# Patient Record
Sex: Male | Born: 1975 | Race: White | Hispanic: No | Marital: Married | Smoking: Never smoker
Health system: Southern US, Community
[De-identification: ages and names within clinical notes are randomized; demographics above are authoritative.]

## PROBLEM LIST (undated history)

## (undated) DIAGNOSIS — E079 Disorder of thyroid, unspecified: Secondary | ICD-10-CM

## (undated) DIAGNOSIS — Z9289 Personal history of other medical treatment: Secondary | ICD-10-CM

## (undated) DIAGNOSIS — G4700 Insomnia, unspecified: Secondary | ICD-10-CM

## (undated) DIAGNOSIS — E790 Hyperuricemia without signs of inflammatory arthritis and tophaceous disease: Secondary | ICD-10-CM

## (undated) DIAGNOSIS — I10 Essential (primary) hypertension: Secondary | ICD-10-CM

## (undated) DIAGNOSIS — K5900 Constipation, unspecified: Secondary | ICD-10-CM

## (undated) DIAGNOSIS — N19 Unspecified kidney failure: Secondary | ICD-10-CM

## (undated) DIAGNOSIS — E213 Hyperparathyroidism, unspecified: Secondary | ICD-10-CM

## (undated) DIAGNOSIS — K219 Gastro-esophageal reflux disease without esophagitis: Secondary | ICD-10-CM

## (undated) DIAGNOSIS — K259 Gastric ulcer, unspecified as acute or chronic, without hemorrhage or perforation: Secondary | ICD-10-CM

## (undated) HISTORY — PX: KIDNEY SURGERY: SHX687

## (undated) HISTORY — PX: APPENDECTOMY: SHX54

## (undated) HISTORY — DX: Essential (primary) hypertension: I10

## (undated) HISTORY — PX: COLONOSCOPY: SHX174

---

## 2002-03-08 DIAGNOSIS — K259 Gastric ulcer, unspecified as acute or chronic, without hemorrhage or perforation: Secondary | ICD-10-CM

## 2002-03-08 HISTORY — DX: Gastric ulcer, unspecified as acute or chronic, without hemorrhage or perforation: K25.9

## 2007-08-19 ENCOUNTER — Emergency Department (HOSPITAL_BASED_OUTPATIENT_CLINIC_OR_DEPARTMENT_OTHER): Admission: EM | Admit: 2007-08-19 | Discharge: 2007-08-19 | Payer: Self-pay | Admitting: Emergency Medicine

## 2010-12-03 LAB — DIFFERENTIAL
Basophils Absolute: 0.1
Basophils Relative: 1
Eosinophils Absolute: 0
Eosinophils Relative: 0
Lymphocytes Relative: 9 — ABNORMAL LOW
Lymphs Abs: 1.5
Monocytes Absolute: 0.9
Monocytes Relative: 6
Neutro Abs: 13.5 — ABNORMAL HIGH
Neutrophils Relative %: 84 — ABNORMAL HIGH

## 2010-12-03 LAB — CBC
HCT: 40.4
Hemoglobin: 13.9
MCHC: 34.4
MCV: 89.3
Platelets: 236
RBC: 4.53
RDW: 12.8
WBC: 16 — ABNORMAL HIGH

## 2010-12-03 LAB — COMPREHENSIVE METABOLIC PANEL WITH GFR
ALT: 8
AST: 18
Albumin: 4.5
Alkaline Phosphatase: 98
Calcium: 10.1
GFR calc Af Amer: 18 — ABNORMAL LOW
Glucose, Bld: 115 — ABNORMAL HIGH
Potassium: 5.3 — ABNORMAL HIGH
Sodium: 143
Total Protein: 8.1

## 2010-12-03 LAB — COMPREHENSIVE METABOLIC PANEL
BUN: 66 — ABNORMAL HIGH
CO2: 18 — ABNORMAL LOW
Chloride: 113 — ABNORMAL HIGH
Creatinine, Ser: 4.6 — ABNORMAL HIGH
GFR calc non Af Amer: 15 — ABNORMAL LOW
Total Bilirubin: 1

## 2011-04-24 ENCOUNTER — Emergency Department (HOSPITAL_BASED_OUTPATIENT_CLINIC_OR_DEPARTMENT_OTHER)
Admission: EM | Admit: 2011-04-24 | Discharge: 2011-04-24 | Disposition: A | Discharge status: Home / Self Care | Payer: 59 | Attending: Emergency Medicine | Admitting: Emergency Medicine

## 2011-04-24 ENCOUNTER — Encounter (HOSPITAL_BASED_OUTPATIENT_CLINIC_OR_DEPARTMENT_OTHER): Payer: Self-pay | Admitting: *Deleted

## 2011-04-24 DIAGNOSIS — A088 Other specified intestinal infections: Secondary | ICD-10-CM | POA: Insufficient documentation

## 2011-04-24 DIAGNOSIS — N179 Acute kidney failure, unspecified: Secondary | ICD-10-CM | POA: Insufficient documentation

## 2011-04-24 DIAGNOSIS — A084 Viral intestinal infection, unspecified: Secondary | ICD-10-CM

## 2011-04-24 DIAGNOSIS — N189 Chronic kidney disease, unspecified: Secondary | ICD-10-CM

## 2011-04-24 DIAGNOSIS — E860 Dehydration: Secondary | ICD-10-CM | POA: Insufficient documentation

## 2011-04-24 DIAGNOSIS — R112 Nausea with vomiting, unspecified: Secondary | ICD-10-CM | POA: Insufficient documentation

## 2011-04-24 HISTORY — DX: Unspecified kidney failure: N19

## 2011-04-24 LAB — BASIC METABOLIC PANEL
CO2: 17 mEq/L — ABNORMAL LOW (ref 19–32)
Chloride: 113 mEq/L — ABNORMAL HIGH (ref 96–112)
Glucose, Bld: 125 mg/dL — ABNORMAL HIGH (ref 70–99)
Potassium: 5.1 mEq/L (ref 3.5–5.1)
Sodium: 141 mEq/L (ref 135–145)

## 2011-04-24 LAB — DIFFERENTIAL
Basophils Absolute: 0 10*3/uL (ref 0.0–0.1)
Lymphocytes Relative: 5 % — ABNORMAL LOW (ref 12–46)
Monocytes Absolute: 1 10*3/uL (ref 0.1–1.0)
Neutro Abs: 21 10*3/uL — ABNORMAL HIGH (ref 1.7–7.7)
Neutrophils Relative %: 91 % — ABNORMAL HIGH (ref 43–77)

## 2011-04-24 LAB — COMPREHENSIVE METABOLIC PANEL
ALT: 15 U/L (ref 0–53)
AST: 11 U/L (ref 0–37)
Albumin: 4 g/dL (ref 3.5–5.2)
Alkaline Phosphatase: 91 U/L (ref 39–117)
BUN: 77 mg/dL — ABNORMAL HIGH (ref 6–23)
Chloride: 110 mEq/L (ref 96–112)
Potassium: 4.5 mEq/L (ref 3.5–5.1)
Sodium: 141 mEq/L (ref 135–145)
Total Bilirubin: 0.7 mg/dL (ref 0.3–1.2)

## 2011-04-24 LAB — CBC
HCT: 38.4 % — ABNORMAL LOW (ref 39.0–52.0)
Hemoglobin: 13.3 g/dL (ref 13.0–17.0)
RDW: 13.4 % (ref 11.5–15.5)
WBC: 23.2 10*3/uL — ABNORMAL HIGH (ref 4.0–10.5)

## 2011-04-24 LAB — URINALYSIS, ROUTINE W REFLEX MICROSCOPIC
Bilirubin Urine: NEGATIVE
Glucose, UA: 100 mg/dL — AB
Ketones, ur: NEGATIVE mg/dL
Protein, ur: >300 mg/dL — AB

## 2011-04-24 LAB — URINE MICROSCOPIC-ADD ON

## 2011-04-24 MED ORDER — HYDROCODONE-ACETAMINOPHEN 5-325 MG PO TABS: 2.0000 | ORAL_TABLET | ORAL | Status: AC | PRN

## 2011-04-24 MED ORDER — HYDROMORPHONE HCL PF 1 MG/ML IJ SOLN
1.0000 mg | Freq: Once | INTRAMUSCULAR | Status: AC
  Administered 2011-04-24: 1 mg via INTRAVENOUS
  Filled 2011-04-24: qty 1

## 2011-04-24 MED ORDER — ONDANSETRON HCL 8 MG PO TABS: 8.0000 mg | ORAL_TABLET | Freq: Three times a day (TID) | ORAL | Status: AC | PRN

## 2011-04-24 MED ORDER — SODIUM CHLORIDE 0.9 % IV BOLUS (SEPSIS)
1000.0000 mL | Freq: Once | INTRAVENOUS | Status: AC
  Administered 2011-04-24: 1000 mL via INTRAVENOUS

## 2011-04-24 MED ORDER — SODIUM CHLORIDE 0.9 % IV SOLN
INTRAVENOUS | Status: DC
  Administered 2011-04-24: 13:00:00 via INTRAVENOUS

## 2011-04-24 MED ORDER — FAMOTIDINE 20 MG PO TABS: 20.0000 mg | ORAL_TABLET | Freq: Two times a day (BID) | ORAL | Status: DC | PRN

## 2011-04-24 MED ORDER — ONDANSETRON HCL 4 MG/2ML IJ SOLN
4.0000 mg | Freq: Once | INTRAMUSCULAR | Status: AC
  Administered 2011-04-24: 4 mg via INTRAVENOUS
  Filled 2011-04-24: qty 2

## 2011-04-24 MED ORDER — FAMOTIDINE IN NACL 20-0.9 MG/50ML-% IV SOLN
20.0000 mg | Freq: Once | INTRAVENOUS | Status: AC
  Administered 2011-04-24: 20 mg via INTRAVENOUS
  Filled 2011-04-24: qty 50

## 2011-04-24 NOTE — Discharge Instructions (Signed)
Dehydration Dehydration is the reduction of water and fluid from the body to a level below that required for proper functioning. CAUSES  Dehydration occurs when there is excessive fluid loss from the body or when loss of normal fluids is not adequately replaced.  Loss of fluids occurs in vomiting, diarrhea, excessive sweating, excessive urine output, or excessive loss of fluid from the lungs (as occurs in fever or in patients on a ventilator).   Inadequate fluid replacement occurs with nausea or decreased appetite due to illness, sore throat, or mouth pain.  SYMPTOMS  Mild dehydration  Thirst (infants and young children may not be able to tell you they are thirsty).   Dry lips.   Slightly dry mouth membranes.  Moderate dehydration  Very dry mouth membranes.   Sunken eyes.   Sunken soft spot (fontanelle) on infant's head.   Skin does not bounce back quickly when lightly pinched and released.   Decreased urine production.   Decreased tear production.  Severe dehydration  Rapid, weak pulse (more than 100 beats per minute at rest).   Cold hands and feet.   Loss of ability to sweat in spite of heat and temperature.   Rapid breathing.   Blue lips.   Confusion, lethargy, difficult to arouse.   Minimal urine production.   No tears.  DIAGNOSIS  Your caregiver will diagnose dehydration based on your symptoms and your exam. Blood and urine tests will help confirm the diagnosis. The diagnostic evaluation should also identify the cause of dehydration. PREVENTION  The body depends on a proper balance of fluid and salts (electrolytes) for normal function. Adequate fluid intake in the presence of illness or other stresses (such as extreme exercise) is important.  TREATMENT   Mild dehydration is safe to self-treat for most ages as long as it does not worsen. Contact your caregiver for even mild dehydration in infants and the elderly.   In teenagers and adults with moderate  dehydration, careful home treatment (as outlined below) can be safe. Phone contact with a caregiver is advised. Children under 53 years of age with moderate dehydration should see a caregiver.   If you or your child is severely dehydrated, go to a hospital for treatment. Intravenous (IV) fluids will quickly reverse dehydration and are often lifesaving in young children, infants, and elderly persons.  HOME CARE INSTRUCTIONS  Small amounts of fluids should be taken frequently. Large amounts at one time may not be tolerated. Plain water may be harmful in infants and the elderly. Oral rehydration solutions (ORS) are available at pharmacies and grocery stores. ORS replaces water and important electrolytes in proper proportions. Sports drinks are not as effective as ORS and may be harmful because the sugar can make diarrhea worse.  As a general guideline for children, replace any new fluid losses from diarrhea and/or vomiting with ORS as follows:   If your child weighs 22 pounds or under (10 kg or less), give 60-120 mL (1/4-1/2 cup or 2-4 ounces) of ORS for each diarrheal stool or vomiting episode.   If your child weighs more than 22 pounds (more than 10 kg), give 120-240 mL (1/2-1 cup or 4-8 ounces) of ORS for each diarrheal stool or vomiting episode.   If your child is vomiting, it may be helpful to give the above ORS replacement in 5 mL (1 teaspoon) amounts every 5 minutes and increase as tolerated.   While correcting for dehydration, children should eat normally. However, foods high in sugar should be  avoided because they may worsen diarrhea. Large amounts of carbonated soft drinks, juice, gelatin desserts, and other highly sugared drinks should be avoided.   After correction of dehydration, other liquids that are appealing to the child may be added. Children should drink small amounts of fluids frequently and fluids should be increased as tolerated. Children should drink enough fluids to keep urine  clear or pale yellow.   Adults should eat normally while drinking more fluids than usual. Drink small amounts of fluids frequently and increase the amount as tolerated. Drink enough fluids to keep urine clear or pale yellow. Broths, weak decaffeinated tea, lemon-lime soft drinks (allowed to go flat), and ORS replace fluids and electrolytes.   Avoid:   Carbonated drinks.   Juice.   Extremely hot or cold fluids.   Caffeine drinks.   Fatty, greasy foods.   Alcohol.   Tobacco.   Too much intake of anything at one time.   Gelatin desserts.   Probiotics are active cultures of beneficial bacteria. They may lessen the amount and number of diarrheal stools in adults. Probiotics can be found in yogurt with active cultures and in supplements.   Wash your hands well to avoid spreading germs (bacteria) and viruses.   Antidiarrheal medicines are not recommended for infants and children.   Only take over-the-counter or prescription medicines for pain, discomfort, or fever as directed by your caregiver. Do not give aspirin to children.   For adults with dehydration, ask your caregiver if you should continue all prescribed and over-the-counter medicines.   If your caregiver has given you a follow-up appointment, it is very important to keep that appointment. Not keeping the appointment could result in a lasting (chronic) or permanent injury and disability. If there is any problem keeping the appointment, you must call to reschedule.  SEEK IMMEDIATE MEDICAL CARE IF:   You are unable to keep fluids down or other symptoms become worse despite treatment.   Vomiting or diarrhea develops and becomes persistent.   There is vomiting of blood or green matter (bile).   There is blood in the stool or the stools are black and tarry.   There is no urine output in 6 to 8 hours or there is only a small amount of very dark urine.   Abdominal pain develops, increases, or localizes.   You or your child  has an oral temperature above 102 F (38.9 C), not controlled by medicine.   Your baby is older than 3 months with a rectal temperature of 102.35F (38.9 C) or higher.   Your baby is 77 months old or younger with a rectal temperature of 100.4 F (38 C) or higher.   You develop excessive weakness, dizziness, fainting, or extreme thirst.   You develop a rash, stiff neck, severe headache, or you become irritable, sleepy, or difficult to awaken.  MAKE SURE YOU:   Understand these instructions.   Will watch your condition.   Will get help right away if you are not doing well or get worse.  Document Released: 02/22/2005 Document Revised: 09/07/2010 Document Reviewed: 01/21/2009 [facility] Patient Information 2012 [place], [place].B.R.A.T. Diet Your doctor has recommended the B.R.A.T. diet for you or your child until the condition improves. This is often used to help control diarrhea and vomiting symptoms. If you or your child can tolerate clear liquids, you may have:  Bananas.   Rice.   Applesauce.   Toast (and other simple starches such as crackers, potatoes, noodles).  Be sure to  avoid dairy products, meats, and fatty foods until symptoms are better. Fruit juices such as apple, grape, and prune juice can make diarrhea worse. Avoid these. Continue this diet for 2 days or as instructed by your caregiver. Document Released: 02/22/2005 Document Revised: 11/04/2010 Document Reviewed: 08/11/2006 [facility] Patient Information 2012 [place], [place].Kidney Failure In kidney failure, the kidneys lose their ability to filter enough waste products from the blood. They also lose the ability to regulate the body's balance of salt and water. Eventually, the kidneys slow their production of urine or stop producing it completely. Waste products and water gather in the body. This can lead to a life-threatening overload of fluids (such as heart failure). It can also lead to a dangerous buildup of waste products  in the blood. These extreme changes in blood chemistry can affect the function of the heart and brain.  TYPES OF KIDNEY FAILURE Acute kidney failure. In this form of kidney failure, the kidneys stop working properly because of a sudden illness, a medicine, or a medical condition that causes one of the following:   A severe drop in blood pressure or an interruption in the normal blood flow to the kidneys. This can occur during:   Major surgery.   Severe burns with fluid loss.   Massive bleeding.   A heart attack that severely affects heart function.   Blood clots that travel to the kidney.   Direct damage to kidney cells or to the kidneys' filtering units. This can be caused by:   An inflammation of the kidneys.   Toxic chemicals.   Medicines or infections.   Blocked urine flow from the kidney. This can occur because of obstructions outside the kidney, such as:   Kidney stones.   Bladder tumors.   An enlarged prostate.  Blockage of urine flow within the kidney can also cause sudden kidney failure, as can occur with large muscle injuries.  Chronic kidney failure. In this form of kidney failure, the kidney gradually loses function. This happens over a period of years. It is a slow and gradual loss of the ability of the kidneys to send out wastes, concentrate urine, and conserve the salts in your blood. Some of the causes of chronic kidney failure are:  Diabetes (very common cause).   Polycystic kidney disease.   Glomerulonephritis.   Alport syndrome.   The flow of urine out of the kidney is blocked (obstructive uropathy).   High blood pressure (very common).   Long-term exposure to lead, mercury, and other chemicals and medicines.   Kidney stones with infection.   Reflux nephropathy.   Pain medicine overuse.  Some forms of chronic kidney failure run in families. Your caregiver will ask you about family medical problems.  End-stage kidney disease (ESKD). This is also  called end-stage kidney failure. In ESKD, kidney function worsens until the person dies. This is usually the result of longstanding chronic kidney failure, but sometimes it follows acute kidney failure. SYMPTOMS  Symptoms vary depending on the type of kidney failure.   Acute kidney failure. Symptoms include:   Swelling (edema) resulting from salt and water overload.   High blood pressure.   Vomiting.   Tiredness (lethargy) caused by the toxic effects of waste products on brain function.   Feeling sick to your stomach (nauseous).   Decreased urine output.   Chronic kidney failure and ERSD. Because the kidney damage in chronic kidney failure occurs slowly over a long time, symptoms develop slowly. Symptoms can include:  Headache.   Weakness.   Itching.   Vomiting.   Pale skin.   Slowing of growth in children.   Fatigue.   Tiredness (lethargy).   Poor appetite.   Increased thirst.   High blood pressure.   Bone damage in adults.  DIAGNOSIS  If you have an illness or medical condition that increases the risk of acute kidney failure, your caregivers will watch you closely. You may have blood and urine tests that measure the function of your kidneys. If you have a medical condition that increases the risk of long-term kidney damage, your caregiver will check your blood pressure and look for symptoms of chronic kidney failure during rechecks. TREATMENT  Treatment depends on the type of kidney failure.   Acute kidney failure. Treatment begins with measures to correct the cause of kidney failure (shock, hemorrhage, burns, heart attack). After this has begun, more specific kidney treatment may include:   Fluids given through the vein (intravenously) to correct any abnormal fluid loss.   Medicines called diuretics that increase urine output.   Limited fluids by mouth.   A diet low in protein and high in carbohydrates.   Medicines to adjust high or low levels of blood  chemicals, such as potassium and medicines to control high blood pressure.   Short-term dialysis may be necessary if the patient develops severe high blood pressure, severe fluid overload, heart failure, symptoms of altered brain function, or severe abnormalities in blood chemistry.   Chronic kidney failure. People with chronic kidney failure are watched closely. They receive frequent physical exams, blood pressure checks, and blood testing. Treatment includes:   A low-protein and low-salt diet.   Medicines to adjust blood chemical levels.   Medicines to treat high blood pressure.   Sometimes, a hormonal medicine called erythropoietin is given to correct a low level of red blood cells (anemia).   ESKD. Treatment includes:   Dialysis until a donor can be found for a kidney transplant. Dialysis mechanically removes waste products from the blood.   Both kidneys may need to be removed surgically before a transplant in patients with severe high blood pressure or chronic pyelonephritis.  PROGNOSIS   Acute kidney failure may go away on its own. Some people recover within a matter of days. Exactly how long the illness lasts varies greatly from person-to-person. The duration depends on the cause of the kidney problem. In rare cases, acute kidney failure progresses to ESKD. Among people who recover, about 50% have some permanent kidney damage. In most cases, this is not severe enough to prevent you from living a normal life.   Chronic kidney failure is a lifelong problem that can worsen over time to become ESKD. Not everyone develops ESKD. For those who do, the time it takes for ESKD to develop varies from person-to-person.   ESKD is a permanent condition that can be treated only with dialysis or a kidney transplant.  PREVENTION  Many forms of kidney failure cannot be prevented. People who have diabetes, high blood pressure, or coronary artery disease should try to control the illness  with:  Appropriate diet.   Medicine.   Lifestyle changes.  If you have chronic kidney failure, you should tell all caregivers who treat you.  HOME CARE INSTRUCTIONS   Follow your diet and take your medicines as instructed.   Do not use any new medicines (prescription, over-the-counter, or nutritional supplements) unless approved by your caregiver. Many medicines can worsen your kidney damage or need to have  the dose adjusted.   If dialysis is scheduled, keep all appointments. Call if you are unable to keep an appointment.  SEEK MEDICAL CARE IF:   You develop unexplained weakness, tiredness, or appetite loss.   You feel poorly with no clear explanation.  SEEK IMMEDIATE MEDICAL CARE IF:   The amount of urine you produce either distinctly increases or decreases.   You develop swelling of the face and/or ankles.   You develop shortness of breath.  FOR MORE INFORMATION  National Institute of Diabetes and Digestive and Kidney Diseases: CheatPrevention.[email or web address] National Kidney Foundation: www.kidney.org Document Released: 02/22/2005 Document Revised: 11/04/2010 Document Reviewed: 06/25/2009 [facility] Patient Information 2012 [place], [place].Kidney Failure In kidney failure, the kidneys lose their ability to filter enough waste products from the blood. They also lose the ability to regulate the body's balance of salt and water. Eventually, the kidneys slow their production of urine or stop producing it completely. Waste products and water gather in the body. This can lead to a life-threatening overload of fluids (such as heart failure). It can also lead to a dangerous buildup of waste products in the blood. These extreme changes in blood chemistry can affect the function of the heart and brain.  TYPES OF KIDNEY FAILURE Acute kidney failure. In this form of kidney failure, the kidneys stop working properly because of a sudden illness, a medicine, or a medical condition that causes one of the  following:   A severe drop in blood pressure or an interruption in the normal blood flow to the kidneys. This can occur during:   Major surgery.   Severe burns with fluid loss.   Massive bleeding.   A heart attack that severely affects heart function.   Blood clots that travel to the kidney.   Direct damage to kidney cells or to the kidneys' filtering units. This can be caused by:   An inflammation of the kidneys.   Toxic chemicals.   Medicines or infections.   Blocked urine flow from the kidney. This can occur because of obstructions outside the kidney, such as:   Kidney stones.   Bladder tumors.   An enlarged prostate.  Blockage of urine flow within the kidney can also cause sudden kidney failure, as can occur with large muscle injuries.  Chronic kidney failure. In this form of kidney failure, the kidney gradually loses function. This happens over a period of years. It is a slow and gradual loss of the ability of the kidneys to send out wastes, concentrate urine, and conserve the salts in your blood. Some of the causes of chronic kidney failure are:  Diabetes (very common cause).   Polycystic kidney disease.   Glomerulonephritis.   Alport syndrome.   The flow of urine out of the kidney is blocked (obstructive uropathy).   High blood pressure (very common).   Long-term exposure to lead, mercury, and other chemicals and medicines.   Kidney stones with infection.   Reflux nephropathy.   Pain medicine overuse.  Some forms of chronic kidney failure run in families. Your caregiver will ask you about family medical problems.  End-stage kidney disease (ESKD). This is also called end-stage kidney failure. In ESKD, kidney function worsens until the person dies. This is usually the result of longstanding chronic kidney failure, but sometimes it follows acute kidney failure. SYMPTOMS  Symptoms vary depending on the type of kidney failure.   Acute kidney failure. Symptoms  include:   Swelling (edema) resulting from salt and water overload.  High blood pressure.   Vomiting.   Tiredness (lethargy) caused by the toxic effects of waste products on brain function.   Feeling sick to your stomach (nauseous).   Decreased urine output.   Chronic kidney failure and ERSD. Because the kidney damage in chronic kidney failure occurs slowly over a long time, symptoms develop slowly. Symptoms can include:   Headache.   Weakness.   Itching.   Vomiting.   Pale skin.   Slowing of growth in children.   Fatigue.   Tiredness (lethargy).   Poor appetite.   Increased thirst.   High blood pressure.   Bone damage in adults.  DIAGNOSIS  If you have an illness or medical condition that increases the risk of acute kidney failure, your caregivers will watch you closely. You may have blood and urine tests that measure the function of your kidneys. If you have a medical condition that increases the risk of long-term kidney damage, your caregiver will check your blood pressure and look for symptoms of chronic kidney failure during rechecks. TREATMENT  Treatment depends on the type of kidney failure.   Acute kidney failure. Treatment begins with measures to correct the cause of kidney failure (shock, hemorrhage, burns, heart attack). After this has begun, more specific kidney treatment may include:   Fluids given through the vein (intravenously) to correct any abnormal fluid loss.   Medicines called diuretics that increase urine output.   Limited fluids by mouth.   A diet low in protein and high in carbohydrates.   Medicines to adjust high or low levels of blood chemicals, such as potassium and medicines to control high blood pressure.   Short-term dialysis may be necessary if the patient develops severe high blood pressure, severe fluid overload, heart failure, symptoms of altered brain function, or severe abnormalities in blood chemistry.   Chronic kidney  failure. People with chronic kidney failure are watched closely. They receive frequent physical exams, blood pressure checks, and blood testing. Treatment includes:   A low-protein and low-salt diet.   Medicines to adjust blood chemical levels.   Medicines to treat high blood pressure.   Sometimes, a hormonal medicine called erythropoietin is given to correct a low level of red blood cells (anemia).   ESKD. Treatment includes:   Dialysis until a donor can be found for a kidney transplant. Dialysis mechanically removes waste products from the blood.   Both kidneys may need to be removed surgically before a transplant in patients with severe high blood pressure or chronic pyelonephritis.  PROGNOSIS   Acute kidney failure may go away on its own. Some people recover within a matter of days. Exactly how long the illness lasts varies greatly from person-to-person. The duration depends on the cause of the kidney problem. In rare cases, acute kidney failure progresses to ESKD. Among people who recover, about 50% have some permanent kidney damage. In most cases, this is not severe enough to prevent you from living a normal life.   Chronic kidney failure is a lifelong problem that can worsen over time to become ESKD. Not everyone develops ESKD. For those who do, the time it takes for ESKD to develop varies from person-to-person.   ESKD is a permanent condition that can be treated only with dialysis or a kidney transplant.  PREVENTION  Many forms of kidney failure cannot be prevented. People who have diabetes, high blood pressure, or coronary artery disease should try to control the illness with:  Appropriate diet.   Medicine.  Lifestyle changes.  If you have chronic kidney failure, you should tell all caregivers who treat you.  HOME CARE INSTRUCTIONS   Follow your diet and take your medicines as instructed.   Do not use any new medicines (prescription, over-the-counter, or nutritional  supplements) unless approved by your caregiver. Many medicines can worsen your kidney damage or need to have the dose adjusted.   If dialysis is scheduled, keep all appointments. Call if you are unable to keep an appointment.  SEEK MEDICAL CARE IF:   You develop unexplained weakness, tiredness, or appetite loss.   You feel poorly with no clear explanation.  SEEK IMMEDIATE MEDICAL CARE IF:   The amount of urine you produce either distinctly increases or decreases.   You develop swelling of the face and/or ankles.   You develop shortness of breath.  FOR MORE INFORMATION  National Institute of Diabetes and Digestive and Kidney Diseases: CheatPrevention.[email or web address] National Kidney Foundation: www.kidney.org Document Released: 02/22/2005 Document Revised: 11/04/2010 Document Reviewed: 06/25/2009 [facility] Patient Information 2012 [place], [place].

## 2011-04-24 NOTE — ED Notes (Signed)
Patient states he woke up at 5am, sweating, nauseous, starting vomiting about 30 minutes, waves of abd pain and vomiting, abd pain

## 2011-04-25 NOTE — ED Provider Notes (Signed)
History     CSN: [id number]  Arrival date & time 04/24/11  0945   First MD Initiated Contact with Patient 04/24/11 1027      Chief Complaint  Patient presents with  . Emesis    (Consider location/radiation/quality/duration/timing/severity/associated sxs/prior treatment) HPI Comments: The patient is a 36 year old male with a history of single left kidney that he reports to me functions at about 13-15%, who this morning awoke with nausea, vomiting, and gradual development subsequently of generalized abdominal cramping and discomfort, bloating. He is last normal bowel movement was yesterday, and he has had no diarrhea. He has been unable to keep down liquids this morning. His abdominal pain is moderate, generalized, nonradiating, aching, cramping in character.  Patient is a 36 y.o. male presenting with vomiting. The history is provided by the patient.  Emesis  This is a new problem. The current episode started 3 to 5 hours ago. The problem occurs 5 to 10 times per day. The problem has not changed since onset.The emesis has an appearance of stomach contents. There has been no fever. Associated symptoms include abdominal pain, chills and sweats. Pertinent negatives include no arthralgias, no cough, no diarrhea, no fever, no headaches, no myalgias and no URI.    Past Medical History  Diagnosis Date  . Kidney failure     Past Surgical History  Procedure Date  . Appendectomy     History reviewed. No pertinent family history.  History  Substance Use Topics  . Smoking status: Never Smoker   . Smokeless tobacco: Not on file  . Alcohol Use: No      Review of Systems  Unable to perform ROS Constitutional: Positive for chills and appetite change. Negative for fever, diaphoresis, activity change, fatigue and unexpected weight change.  HENT: Negative for ear pain, congestion, sore throat, rhinorrhea, mouth sores, trouble swallowing, neck pain, neck stiffness and postnasal drip.   Eyes:  Negative.   Respiratory: Negative for cough, chest tightness, shortness of breath and wheezing.   Cardiovascular: Negative for chest pain, palpitations and leg swelling.  Gastrointestinal: Positive for nausea, vomiting and abdominal pain. Negative for diarrhea, constipation, blood in stool, abdominal distention, anal bleeding and rectal pain.  Genitourinary: Negative for dysuria, urgency, frequency, hematuria and flank pain.  Musculoskeletal: Negative for myalgias, back pain and arthralgias.  Skin: Negative for color change, pallor, rash and wound.  Neurological: Negative for dizziness, syncope, weakness, light-headedness and headaches.  Hematological: Negative for adenopathy.  Psychiatric/Behavioral: Negative.     Allergies  Review of patient's allergies indicates no known allergies.  Home Medications   Current Outpatient Rx  Name Route Sig Dispense Refill  . AMLODIPINE BESYLATE 10 MG PO TABS Oral Take 10 mg by mouth daily.    Marland Kitchen ONE-DAILY MULTI VITAMINS PO TABS Oral Take 1 tablet by mouth daily. Renal supplement    . SODIUM BICARBONATE 650 MG PO TABS Oral Take 650 mg by mouth 2 (two) times daily.    Marland Kitchen VITAMIN D (CHOLECALCIFEROL) PO Oral Take 400 mg by mouth.    . FAMOTIDINE 20 MG PO TABS Oral Take 1 tablet (20 mg total) by mouth 2 (two) times daily as needed for heartburn (upset stomach). 20 tablet 0  . HYDROCODONE-ACETAMINOPHEN 5-325 MG PO TABS Oral Take 2 tablets by mouth every 4 (four) hours as needed for pain. 10 tablet 0  . ONDANSETRON HCL 8 MG PO TABS Oral Take 1 tablet (8 mg total) by mouth every 8 (eight) hours as needed for nausea. 12  tablet 0    BP 153/89  Pulse 109  Temp(Src) 98.3 F (36.8 C) (Oral)  Resp 16  Ht 6' (1.829 m)  Wt 200 lb (90.719 kg)  BMI 27.12 kg/m2  SpO2 99%  Physical Exam  Nursing note and vitals reviewed. Constitutional: He is oriented to person, place, and time. He appears well-developed and well-nourished. He is active.  Non-toxic appearance. He  does not have a sickly appearance. He does not appear ill. No distress.  HENT:  Head: Normocephalic and atraumatic.  Right Ear: Hearing, tympanic membrane, external ear and ear canal normal.  Left Ear: Hearing, tympanic membrane, external ear and ear canal normal.  Nose: Nose normal. No mucosal edema.  Mouth/Throat: Uvula is midline and oropharynx is clear and moist. Mucous membranes are dry. No oral lesions. No uvula swelling. No oropharyngeal exudate, posterior oropharyngeal edema, posterior oropharyngeal erythema or tonsillar abscesses.  Eyes: Conjunctivae and EOM are normal. Pupils are equal, round, and reactive to light. Right eye exhibits no chemosis, no discharge and no exudate. Left eye exhibits no chemosis, no discharge and no exudate. Right conjunctiva is not injected. Left conjunctiva is not injected. No scleral icterus.  Neck: Normal range of motion, full passive range of motion without pain and phonation normal. Neck supple. No rigidity. No Brudzinski's sign noted.  Cardiovascular: Normal rate, regular rhythm, intact distal pulses and normal pulses.   No extrasystoles are present.  Pulmonary/Chest: Effort normal and breath sounds normal. No accessory muscle usage. Not tachypneic. No respiratory distress. He has no decreased breath sounds. He has no wheezes. He has no rhonchi. He has no rales. He exhibits no tenderness, no crepitus and no retraction.  Abdominal: Soft. Normal appearance. He exhibits no shifting dullness, no distension, no pulsatile liver, no fluid wave, no abdominal bruit, no ascites, no pulsatile midline mass and no mass. Bowel sounds are increased. There is no hepatosplenomegaly. There is no tenderness. There is no rigidity, no rebound and no guarding. No hernia.  Musculoskeletal: Normal range of motion.  Neurological: He is alert and oriented to person, place, and time. He has normal strength and normal reflexes. He is not disoriented. No cranial nerve deficit.  Coordination normal. GCS eye subscore is 4. GCS verbal subscore is 5. GCS motor subscore is 6.  Skin: Skin is warm, dry and intact. No bruising, no ecchymosis, no lesion and no rash noted. He is not diaphoretic. No erythema. No pallor.  Psychiatric: He has a normal mood and affect. His speech is normal and behavior is normal. Judgment and thought content normal. Cognition and memory are normal.    ED Course  Procedures (including critical care time)  Labs Reviewed  CBC - Abnormal; Notable for the following:    WBC 23.2 (*)    HCT 38.4 (*)    All other components within normal limits  DIFFERENTIAL - Abnormal; Notable for the following:    Neutrophils Relative 91 (*)    Neutro Abs 21.0 (*)    Lymphocytes Relative 5 (*)    All other components within normal limits  COMPREHENSIVE METABOLIC PANEL - Abnormal; Notable for the following:    CO2 18 (*)    Glucose, Bld 139 (*)    BUN 77 (*)    Creatinine, Ser 6.20 (*)    GFR calc non Af Amer 11 (*)    GFR calc Af Amer 12 (*)    All other components within normal limits  URINALYSIS, ROUTINE W REFLEX MICROSCOPIC - Abnormal; Notable for the  following:    Glucose, UA 100 (*)    Hgb urine dipstick SMALL (*)    Protein, ur >300 (*)    All other components within normal limits  URINE MICROSCOPIC-ADD ON - Abnormal; Notable for the following:    Bacteria, UA FEW (*)    All other components within normal limits  BASIC METABOLIC PANEL - Abnormal; Notable for the following:    Chloride 113 (*)    CO2 17 (*)    Glucose, Bld 125 (*)    BUN 74 (*)    Creatinine, Ser 5.50 (*)    GFR calc non Af Amer 12 (*)    GFR calc Af Amer 14 (*)    All other components within normal limits  LIPASE, BLOOD   No results found.    1. Viral gastroenteritis   2. Renal failure (ARF), acute on chronic   3. Dehydration       MDM  After IV fluid repletion and single dose of IV analgesia, the patient's abdominal pain has completely resolved, and he has no  abdominal tenderness, guarding, or rebound on examination. His abdomen is not suggestive of acute intra-abdominal process. His vomiting has completely resolved and he is tolerating oral fluids well, without further chills. At this time the patient feels asymptomatic and would like to go home. I've reviewed his lab testing, and note that he has a significant leukocytosis, however this is not accompanied by fever, and is accompanied by normal liver function tests, and he has had an appendectomy. I do not suspect a cholecystitis, pancreatitis, bowel obstruction, or intra-abdominal infection. Based on the clinical examination the ED course, despite the leukocytosis, I do not feel that the patient needs CT imaging of the abdomen at this time given the resolution of his symptoms. I also note the patient's renal insufficiency. I discussed this with him and he states that the initial value of the creatinine over 6 is not unusual for him, and that his usual baseline is between 5.5 and 6. I've repeated his electrolytes after 2 L of fluid it may demonstrate a normalization (4 the patient) of his renal function. As he is tolerating oral intake at this time and has no further symptoms, I do find him suitable for discharge home and outpatient treatment. I've discussed all of these considerations with the patient and he states his understanding of and agreement with them and the plan of care.      Felisa Bonier, MD 04/25/11 [phone]

## 2012-06-09 ENCOUNTER — Emergency Department (HOSPITAL_BASED_OUTPATIENT_CLINIC_OR_DEPARTMENT_OTHER)
Admission: EM | Admit: 2012-06-09 | Discharge: 2012-06-09 | Disposition: A | Discharge status: Home / Self Care | Payer: 59 | Attending: Emergency Medicine | Admitting: Emergency Medicine

## 2012-06-09 ENCOUNTER — Emergency Department (HOSPITAL_BASED_OUTPATIENT_CLINIC_OR_DEPARTMENT_OTHER): Payer: 59

## 2012-06-09 ENCOUNTER — Encounter (HOSPITAL_BASED_OUTPATIENT_CLINIC_OR_DEPARTMENT_OTHER): Payer: Self-pay | Admitting: Emergency Medicine

## 2012-06-09 DIAGNOSIS — M79671 Pain in right foot: Secondary | ICD-10-CM

## 2012-06-09 DIAGNOSIS — N19 Unspecified kidney failure: Secondary | ICD-10-CM | POA: Insufficient documentation

## 2012-06-09 DIAGNOSIS — Z79899 Other long term (current) drug therapy: Secondary | ICD-10-CM | POA: Insufficient documentation

## 2012-06-09 DIAGNOSIS — M7989 Other specified soft tissue disorders: Secondary | ICD-10-CM | POA: Insufficient documentation

## 2012-06-09 DIAGNOSIS — M79609 Pain in unspecified limb: Secondary | ICD-10-CM | POA: Insufficient documentation

## 2012-06-09 MED ORDER — OXYCODONE-ACETAMINOPHEN 5-325 MG PO TABS
2.0000 | ORAL_TABLET | Freq: Once | ORAL | Status: AC
  Administered 2012-06-09: 2 via ORAL
  Filled 2012-06-09 (×2): qty 2

## 2012-06-09 MED ORDER — OXYCODONE-ACETAMINOPHEN 5-325 MG PO TABS: 1.0000 | ORAL_TABLET | ORAL | Status: DC | PRN

## 2012-06-09 MED ORDER — OXYCODONE HCL 5 MG PO TABS: 5.0000 mg | ORAL_TABLET | ORAL | Status: DC | PRN

## 2012-06-09 NOTE — ED Provider Notes (Signed)
History     CSN: [id number]  Arrival date & time 06/09/12  0037   First MD Initiated Contact with Patient 06/09/12 [phone]      Chief Complaint  Patient presents with  . Foot Pain    (Consider location/radiation/quality/duration/timing/severity/associated sxs/prior treatment) HPI This is a 37 year old male who went on a 5K walk 6 days ago. Yesterday he began having pain over the base of his right fifth metatarsal. The pain is moderate to severe and is described as feeling like hot coals in his foot. It is worse with palpation. There is some mild swelling and erythema overlying the base of the right fifth metatarsal. There is numbness distally to the right fourth and fifth toes. There is no motor or tendon deficit. There is no deformity. There is no pain in the ankle. He has had swelling over the base of the right fifth metatarsal off and on over the past 6 months this is the first time he has had pain with it.  Past Medical History  Diagnosis Date  . Kidney failure     Pt has one kidney functioning at 12%.  Is on Transplant list.    Past Surgical History  Procedure Laterality Date  . Appendectomy    . Kidney surgery      No family history on file.  History  Substance Use Topics  . Smoking status: Never Smoker   . Smokeless tobacco: Not on file  . Alcohol Use: No      Review of Systems  All other systems reviewed and are negative.    Allergies  Review of patient's allergies indicates no known allergies.  Home Medications   Current Outpatient Rx  Name  Route  Sig  Dispense  Refill  . amLODipine (NORVASC) 10 MG tablet   Oral   Take 10 mg by mouth daily.         . cinacalcet (SENSIPAR) 30 MG tablet   Oral   Take 30 mg by mouth daily. Sun, Mon, Wed, Fri         . doxazosin (CARDURA) 2 MG tablet   Oral   Take 2 mg by mouth at bedtime.         Marland Kitchen EXPIRED: famotidine (PEPCID) 20 MG tablet   Oral   Take 1 tablet (20 mg total) by mouth 2 (two) times daily as  needed for heartburn (upset stomach).   20 tablet   0   . Multiple Vitamin (MULTIVITAMIN) tablet   Oral   Take 1 tablet by mouth daily. Renal supplement         . sodium bicarbonate 650 MG tablet   Oral   Take 650 mg by mouth 2 (two) times daily.         Marland Kitchen VITAMIN D, CHOLECALCIFEROL, PO   Oral   Take 400 mg by mouth.           There were no vitals taken for this visit.  Physical Exam General: Well-developed, well-nourished male in no acute distress; appearance consistent with age of record HENT: normocephalic, atraumatic Eyes: pupils equal round and reactive to light; extraocular muscles intact Neck: supple Heart: regular rate and rhythm Lungs: clear to auscultation bilaterally Abdomen: soft; nondistended; nontender; bowel sounds present Extremities: No deformity; full range of motion; pulses normal; mild erythema, swelling and tenderness overlying the base of the right fifth metatarsal; motor and tendon function intact distally in her right foot; sensation intact in right foot with ulceration over the dorsal  lateral aspect up to the fourth and fifth toes; distal capillary refill is brisk in all toes Neurologic: Awake, alert and oriented; motor function intact in all extremities and symmetric; no facial droop Skin: Warm and dry Psychiatric: Normal mood and affect    ED Course  Procedures (including critical care time)     MDM  Nursing notes and vitals signs, including pulse oximetry, reviewed.  Summary of this visit's results, reviewed by myself:   Imaging Studies: Dg Foot Complete Right  24-Jun-2012  *RADIOLOGY REPORT*  Clinical Data: Right foot pain for 6 months.  No known injury. Pain and swelling across the third through fifth metatarsals.  RIGHT FOOT COMPLETE - 3+ VIEW  Comparison: None.  Findings: Right foot appears intact. No evidence of acute fracture or subluxation.  No focal bone lesions.  Bone matrix and cortex appear intact.  No abnormal radiopaque  densities in the soft tissues.  Tiny plantar calcaneal spur.  IMPRESSION: No acute bony abnormalities demonstrated in the right foot.   Original Report Authenticated By: Burman Nieves, M.D.             Hanley Seamen, MD 24-Jun-2012 [phone]

## 2012-06-09 NOTE — ED Notes (Signed)
Pt did 5K walk 5 days ago.  Started feeling pain in lateral right foot near end of walk.  Has been getting worse since then. 4th and 5th toes numb.

## 2012-06-15 ENCOUNTER — Ambulatory Visit: Payer: 59 | Admitting: Family Medicine

## 2012-06-15 ENCOUNTER — Ambulatory Visit (INDEPENDENT_AMBULATORY_CARE_PROVIDER_SITE_OTHER): Payer: 59 | Admitting: Family Medicine

## 2012-06-15 ENCOUNTER — Encounter: Payer: Self-pay | Admitting: Family Medicine

## 2012-06-15 VITALS — BP 163/83 | HR 84 | Ht 72.0 in | Wt 195.0 lb

## 2012-06-15 DIAGNOSIS — M79671 Pain in right foot: Secondary | ICD-10-CM

## 2012-06-15 DIAGNOSIS — M25579 Pain in unspecified ankle and joints of unspecified foot: Secondary | ICD-10-CM

## 2012-06-15 DIAGNOSIS — M79609 Pain in unspecified limb: Secondary | ICD-10-CM

## 2012-06-15 DIAGNOSIS — M25571 Pain in right ankle and joints of right foot: Secondary | ICD-10-CM

## 2012-06-15 MED ORDER — PREDNISONE (PAK) 10 MG PO TABS: ORAL_TABLET | ORAL | Status: DC

## 2012-06-15 NOTE — Patient Instructions (Addendum)
This is consistent with an acute gout flare. Take prednisone x 6 days as directed. Ok to take tylenol 500 mg 1-2 tabs three times a day as needed for pain. Follow up with me in 1-2 weeks for reevaluation. Consider colchicine if not improving. Consider Dr. Jari Sportsman active series insoles for comfort as needed. Icing 15 minutes at a time as needed.

## 2012-06-19 ENCOUNTER — Encounter: Payer: Self-pay | Admitting: Family Medicine

## 2012-06-19 DIAGNOSIS — M79671 Pain in right foot: Secondary | ICD-10-CM | POA: Insufficient documentation

## 2012-06-19 NOTE — Progress Notes (Signed)
Subjective:    Patient ID: Mitchell Bryant, male    DOB: November 15, 1975, 37 y.o.   MRN: [id number]  PCP: Dr. Derrell Lolling  HPI 37 yo M here for right foot pain.  Patient denies known injury. States he walked a 5K more than a couple weeks ago. No problems with this. Then on Wednesday right foot was a little swollen and achy. Much worse Thursday. Lots of pain, swelling, difficulty walking. Burning with radiation dorsal foot into plantar foot. Had something similar several months ago but resolved after a few days. Has tried icing and heat. No known history of gout.  Past Medical History  Diagnosis Date  . Kidney failure     Pt has one kidney functioning at 12%.  Is on Transplant list.  . Hypertension     Current Outpatient Prescriptions on File Prior to Visit  Medication Sig Dispense Refill  . amLODipine (NORVASC) 10 MG tablet Take 10 mg by mouth daily.      . cinacalcet (SENSIPAR) 30 MG tablet Take 30 mg by mouth daily. Sun, Mon, Wed, Fri      . doxazosin (CARDURA) 2 MG tablet Take 2 mg by mouth at bedtime.      . Multiple Vitamin (MULTIVITAMIN) tablet Take 1 tablet by mouth daily. Renal supplement      . sodium bicarbonate 650 MG tablet Take 650 mg by mouth 2 (two) times daily.      Marland Kitchen VITAMIN D, CHOLECALCIFEROL, PO Take 400 mg by mouth.      . famotidine (PEPCID) 20 MG tablet Take 1 tablet (20 mg total) by mouth 2 (two) times daily as needed for heartburn (upset stomach).  20 tablet  0  . oxyCODONE (OXY IR/ROXICODONE) 5 MG immediate release tablet Take 1-2 tablets (5-10 mg total) by mouth every 4 (four) hours as needed for pain.  30 tablet  0   No current facility-administered medications on file prior to visit.    Past Surgical History  Procedure Laterality Date  . Appendectomy    . Kidney surgery      No Known Allergies  History   Social History  . Marital Status: Married    Spouse Name: N/A    Number of Children: N/A  . Years of Education: N/A   Occupational History  .  Not on file.   Social History Main Topics  . Smoking status: Never Smoker   . Smokeless tobacco: Not on file  . Alcohol Use: No  . Drug Use: No  . Sexually Active: Not on file   Other Topics Concern  . Not on file   Social History Narrative  . No narrative on file    History reviewed. No pertinent family history.  BP 163/83  Pulse 84  Ht 6' (1.829 m)  Wt 195 lb (88.451 kg)  BMI 26.44 kg/m2  Review of Systems See HPI above.    Objective:   Physical Exam Gen: NAD  R foot/ankle: Mild swelling, warmth dorsal foot.  No erythema, other deformity.  No skin breakdown. FROM ankle without pain. TTP greatest at tarsal metatarsal junction all the way across foot.  Tender proximally and distal to here as well but less so. Negative ant drawer and talar tilt.   Negative syndesmotic compression. Thompsons test negative. Pain with metatarsal squeeze. NV intact distally.  MSK u/s:  No evidence of cortical irregularity of focal edema overlying cortex.  Significant neovascularity within TMT joint especially at 1st-3rd metatarsals.      Assessment &  Plan:  1. Right foot pain - no injury, associated swelling and warmth without trauma and no evidence of skin breakdown all consistent with an acute gout flare.  He has renal failure so cannot use nsaids.  Would need to dose adjust colchicine.  He would like to try prednisone dose pack first.  Discussed dietary measures as well.  F/u in 1-2 weeks for reevaluation.  Can take tylenol in addition to this.

## 2012-06-19 NOTE — Assessment & Plan Note (Signed)
no injury, associated swelling and warmth without trauma and no evidence of skin breakdown all consistent with an acute gout flare.  He has renal failure so cannot use nsaids.  Would need to dose adjust colchicine.  He would like to try prednisone dose pack first.  Discussed dietary measures as well.  F/u in 1-2 weeks for reevaluation.  Can take tylenol in addition to this.

## 2013-02-03 ENCOUNTER — Emergency Department (HOSPITAL_BASED_OUTPATIENT_CLINIC_OR_DEPARTMENT_OTHER)
Admission: EM | Admit: 2013-02-03 | Discharge: 2013-02-03 | Disposition: A | Discharge status: Home / Self Care | Payer: 59 | Attending: Emergency Medicine | Admitting: Emergency Medicine

## 2013-02-03 ENCOUNTER — Encounter (HOSPITAL_BASED_OUTPATIENT_CLINIC_OR_DEPARTMENT_OTHER): Payer: Self-pay | Admitting: Emergency Medicine

## 2013-02-03 ENCOUNTER — Emergency Department (HOSPITAL_BASED_OUTPATIENT_CLINIC_OR_DEPARTMENT_OTHER): Payer: 59

## 2013-02-03 DIAGNOSIS — Z79899 Other long term (current) drug therapy: Secondary | ICD-10-CM | POA: Insufficient documentation

## 2013-02-03 DIAGNOSIS — M7918 Myalgia, other site: Secondary | ICD-10-CM

## 2013-02-03 DIAGNOSIS — Z87448 Personal history of other diseases of urinary system: Secondary | ICD-10-CM | POA: Insufficient documentation

## 2013-02-03 DIAGNOSIS — I10 Essential (primary) hypertension: Secondary | ICD-10-CM | POA: Insufficient documentation

## 2013-02-03 DIAGNOSIS — IMO0001 Reserved for inherently not codable concepts without codable children: Secondary | ICD-10-CM | POA: Insufficient documentation

## 2013-02-03 LAB — CBC WITH DIFFERENTIAL/PLATELET
Basophils Absolute: 0.1 10*3/uL (ref 0.0–0.1)
Basophils Relative: 1 % (ref 0–1)
Eosinophils Absolute: 0.2 10*3/uL (ref 0.0–0.7)
HCT: 34.3 % — ABNORMAL LOW (ref 39.0–52.0)
Lymphocytes Relative: 24 % (ref 12–46)
MCHC: 33.2 g/dL (ref 30.0–36.0)
MCV: 92 fL (ref 78.0–100.0)
Monocytes Absolute: 0.6 10*3/uL (ref 0.1–1.0)
Neutro Abs: 5.1 10*3/uL (ref 1.7–7.7)
Neutrophils Relative %: 66 % (ref 43–77)
Platelets: 257 10*3/uL (ref 150–400)
RDW: 13.1 % (ref 11.5–15.5)
WBC: 7.8 10*3/uL (ref 4.0–10.5)

## 2013-02-03 LAB — COMPREHENSIVE METABOLIC PANEL
ALT: 11 U/L (ref 0–53)
Albumin: 3.8 g/dL (ref 3.5–5.2)
Alkaline Phosphatase: 145 U/L — ABNORMAL HIGH (ref 39–117)
BUN: 75 mg/dL — ABNORMAL HIGH (ref 6–23)
Chloride: 105 mEq/L (ref 96–112)
GFR calc Af Amer: 10 mL/min — ABNORMAL LOW (ref >90)
Glucose, Bld: 109 mg/dL — ABNORMAL HIGH (ref 70–99)
Potassium: 5.1 mEq/L (ref 3.5–5.1)
Sodium: 139 mEq/L (ref 135–145)
Total Bilirubin: 0.6 mg/dL (ref 0.3–1.2)

## 2013-02-03 MED ORDER — OXYCODONE HCL 5 MG PO TABS: 5.0000 mg | ORAL_TABLET | ORAL | Status: DC | PRN

## 2013-02-03 MED ORDER — OXYCODONE HCL 5 MG PO TABS
5.0000 mg | ORAL_TABLET | Freq: Once | ORAL | Status: AC
  Administered 2013-02-03: 5 mg via ORAL
  Filled 2013-02-03: qty 1

## 2013-02-03 MED ORDER — CYCLOBENZAPRINE HCL 10 MG PO TABS
ORAL_TABLET | ORAL | Status: AC
  Administered 2013-02-03: 5 mg via ORAL
  Filled 2013-02-03: qty 1

## 2013-02-03 MED ORDER — CYCLOBENZAPRINE HCL 5 MG PO TABS: 5.0000 mg | ORAL_TABLET | Freq: Three times a day (TID) | ORAL | Status: DC | PRN

## 2013-02-03 MED ORDER — CYCLOBENZAPRINE HCL 10 MG PO TABS
5.0000 mg | ORAL_TABLET | Freq: Once | ORAL | Status: AC
  Administered 2013-02-03: 5 mg via ORAL

## 2013-02-03 NOTE — ED Provider Notes (Signed)
CSN: [id number]     Arrival date & time 02/03/13  0800 History   First MD Initiated Contact with Patient 02/03/13 [phone]     Chief Complaint  Patient presents with  . Back Pain   (Consider location/radiation/quality/duration/timing/severity/associated sxs/prior Treatment) Patient is a 37 y.o. male presenting with back pain. The history is provided by the patient and the spouse.  Back Pain Pain location: left trapezius. Quality:  Aching, cramping and shooting Radiates to:  L shoulder Pain severity:  Severe Pain is:  Same all the time Onset quality:  Sudden Duration:  2 days Timing:  Constant Progression:  Unchanged Chronicity:  New Context: lifting heavy objects and physical stress   Context: not falling, not MVA, not pedestrian accident, not recent illness, not recent injury and not twisting   Relieved by: raising hand above head. Exacerbated by: use of left arm. Associated symptoms: no abdominal pain, no abdominal swelling, no bladder incontinence, no bowel incontinence, no chest pain, no dysuria, no fever, no headaches, no leg pain, no numbness, no paresthesias, no pelvic pain, no perianal numbness, no tingling, no weakness and no weight loss     Past Medical History  Diagnosis Date  . Kidney failure     Pt has one kidney functioning at 12%.  Is on Transplant list.  . Hypertension    Past Surgical History  Procedure Laterality Date  . Appendectomy    . Kidney surgery     No family history on file. History  Substance Use Topics  . Smoking status: Never Smoker   . Smokeless tobacco: Not on file  . Alcohol Use: No    Review of Systems  Constitutional: Negative for fever and weight loss.  Cardiovascular: Negative for chest pain.  Gastrointestinal: Negative for abdominal pain and bowel incontinence.  Genitourinary: Negative for bladder incontinence, dysuria and pelvic pain.  Musculoskeletal: Positive for back pain.  Neurological: Negative for tingling, weakness,  numbness, headaches and paresthesias.    Allergies  Review of patient's allergies indicates no known allergies.  Home Medications   Current Outpatient Rx  Name  Route  Sig  Dispense  Refill  . amLODipine (NORVASC) 10 MG tablet   Oral   Take 10 mg by mouth daily.         . B Complex-C-Folic Acid (RENO CAPS) 1 MG CAPS               . cinacalcet (SENSIPAR) 30 MG tablet   Oral   Take 60 mg by mouth daily. Sun, Mon, Wed, Fri         . doxazosin (CARDURA) 2 MG tablet   Oral   Take 2 mg by mouth at bedtime.         Marland Kitchen EXPIRED: famotidine (PEPCID) 20 MG tablet   Oral   Take 1 tablet (20 mg total) by mouth 2 (two) times daily as needed for heartburn (upset stomach).   20 tablet   0   . Multiple Vitamin (MULTIVITAMIN) tablet   Oral   Take 1 tablet by mouth daily. Renal supplement         . oxyCODONE (OXY IR/ROXICODONE) 5 MG immediate release tablet   Oral   Take 1-2 tablets (5-10 mg total) by mouth every 4 (four) hours as needed for pain.   30 tablet   0   . predniSONE (STERAPRED UNI-PAK) 10 MG tablet      6 tabs po day 1, 5 tabs po day 2, 4 tabs po day  3, 3 tabs po day 4, 2 tabs po day 5, 1 tab po day 6   21 tablet   0   . sodium bicarbonate 650 MG tablet   Oral   Take 650 mg by mouth 2 (two) times daily.         Marland Kitchen VITAMIN D, CHOLECALCIFEROL, PO   Oral   Take 400 mg by mouth.          BP 145/89  Pulse 83  Temp(Src) 97.8 F (36.6 C) (Oral)  Resp 20  SpO2 100% Physical Exam  Constitutional: He is oriented to person, place, and time. He appears well-developed and well-nourished. No distress.  HENT:  Head: Normocephalic.  Mouth/Throat: Oropharynx is clear and moist. No oropharyngeal exudate.  Eyes: Conjunctivae and EOM are normal. Pupils are equal, round, and reactive to light.  Neck: Normal range of motion and full passive range of motion without pain. Neck supple. No JVD present. Muscular tenderness present. No spinous process tenderness present.  No rigidity. No tracheal deviation, no edema, no erythema and normal range of motion present. No thyromegaly present.    Tender to palpation in the trapezius anad rhomboid area.  Cardiovascular: Normal rate, regular rhythm, normal heart sounds and intact distal pulses.  Exam reveals no gallop and no friction rub.   No murmur heard. Pulmonary/Chest: Effort normal and breath sounds normal. No stridor. No respiratory distress. He has no wheezes. He has no rales. He exhibits no tenderness.  Abdominal: Soft. Bowel sounds are normal. He exhibits no distension. There is no tenderness. There is no rebound.  Lymphadenopathy:    He has no cervical adenopathy.  Neurological: He is alert and oriented to person, place, and time. No cranial nerve deficit.  Muscle Strength 5/5 in biceps, triceps, BR, wrist flexion extension and grip in bil arms.  Skin: He is not diaphoretic.  Psychiatric: He has a normal mood and affect. His behavior is normal.    ED Course  Procedures (including critical care time) Labs Review Labs Reviewed  COMPREHENSIVE METABOLIC PANEL - Abnormal; Notable for the following:    Glucose, Bld 109 (*)    BUN 75 (*)    Creatinine, Ser 7.30 (*)    Calcium 11.7 (*)    Alkaline Phosphatase 145 (*)    GFR calc non Af Amer 9 (*)    GFR calc Af Amer 10 (*)    All other components within normal limits  CBC WITH DIFFERENTIAL - Abnormal; Notable for the following:    RBC 3.73 (*)    Hemoglobin 11.4 (*)    HCT 34.3 (*)    All other components within normal limits   Imaging Review Dg Thoracic Spine 2 View  02/03/2013   CLINICAL DATA:  Pain and spasms  EXAM: THORACIC SPINE - 2 VIEW  COMPARISON:  None.  FINDINGS: There is no evidence of thoracic spine fracture. Alignment is normal. No other significant bone abnormalities are identified.  IMPRESSION: Negative.   Electronically Signed   By: Oley Balm M.D.   On: 02/03/2013 10:16    EKG Interpretation   None       MDM   1.  Myofascial pain     1. Myofascial Pain  The patient most likely has myofascial pain primarily located in the left trapezius and rhomboid. Thoracic spine xray negative for lytic lesions of fracture. There is no signs of cord compression at this time. BP is equal in both arms, no CP and no SOB, thus  Aortic dissection is unlikely. I recommend a trial of muscle relaxants and oxycodone. NSAIDs and tramadol are contraindicated given CKD.   2. Hypercalcemia  The patient has hypercalcemia of unknown etiology at this time. Further evaluation is warranted. Patient has appointment early this week with kidney specialist at [facility]. I encouraged the patient to f/u at this appointment.     Pleas Koch, MD 02/03/13 1036

## 2013-02-03 NOTE — ED Notes (Signed)
Patient c/o shooting pain that goes down L side of neck/shoulder that shoots down his back. No meds taken due to kidney failure

## 2013-02-03 NOTE — ED Provider Notes (Signed)
I saw and evaluated the patient, reviewed the resident's note and I agree with the findings and plan.   .Face to face Exam:  General:  Awake HEENT:  Atraumatic Resp:  Normal effort Abd:  Nondistended Neuro:No focal weakness  Nelia Shi, MD 02/03/13 1045

## 2013-03-09 ENCOUNTER — Other Ambulatory Visit: Payer: Self-pay | Admitting: Neurosurgery

## 2013-03-09 ENCOUNTER — Encounter (HOSPITAL_COMMUNITY): Payer: Self-pay | Admitting: Pharmacy Technician

## 2013-03-12 ENCOUNTER — Encounter (HOSPITAL_COMMUNITY): Payer: Self-pay

## 2013-03-12 ENCOUNTER — Ambulatory Visit (HOSPITAL_COMMUNITY)
Admission: RE | Admit: 2013-03-12 | Discharge: 2013-03-12 | Disposition: A | Discharge status: Home / Self Care | Payer: 59 | Source: Ambulatory Visit | Attending: Anesthesiology | Admitting: Anesthesiology

## 2013-03-12 ENCOUNTER — Encounter (HOSPITAL_COMMUNITY)
Admission: RE | Admit: 2013-03-12 | Discharge: 2013-03-12 | Disposition: A | Discharge status: Home / Self Care | Payer: 59 | Source: Ambulatory Visit | Attending: Neurosurgery | Admitting: Neurosurgery

## 2013-03-12 DIAGNOSIS — K219 Gastro-esophageal reflux disease without esophagitis: Secondary | ICD-10-CM | POA: Insufficient documentation

## 2013-03-12 DIAGNOSIS — I129 Hypertensive chronic kidney disease with stage 1 through stage 4 chronic kidney disease, or unspecified chronic kidney disease: Secondary | ICD-10-CM | POA: Insufficient documentation

## 2013-03-12 DIAGNOSIS — N184 Chronic kidney disease, stage 4 (severe): Secondary | ICD-10-CM | POA: Insufficient documentation

## 2013-03-12 DIAGNOSIS — Z01818 Encounter for other preprocedural examination: Secondary | ICD-10-CM | POA: Insufficient documentation

## 2013-03-12 DIAGNOSIS — Z01812 Encounter for preprocedural laboratory examination: Secondary | ICD-10-CM | POA: Insufficient documentation

## 2013-03-12 DIAGNOSIS — M542 Cervicalgia: Secondary | ICD-10-CM | POA: Insufficient documentation

## 2013-03-12 DIAGNOSIS — Z01810 Encounter for preprocedural cardiovascular examination: Secondary | ICD-10-CM | POA: Insufficient documentation

## 2013-03-12 HISTORY — DX: Insomnia, unspecified: G47.00

## 2013-03-12 HISTORY — DX: Gastro-esophageal reflux disease without esophagitis: K21.9

## 2013-03-12 HISTORY — DX: Hyperparathyroidism, unspecified: E21.3

## 2013-03-12 HISTORY — DX: Gastric ulcer, unspecified as acute or chronic, without hemorrhage or perforation: K25.9

## 2013-03-12 HISTORY — DX: Constipation, unspecified: K59.00

## 2013-03-12 HISTORY — DX: Hyperuricemia without signs of inflammatory arthritis and tophaceous disease: E79.0

## 2013-03-12 HISTORY — DX: Personal history of other medical treatment: Z92.89

## 2013-03-12 LAB — SURGICAL PCR SCREEN
MRSA, PCR: POSITIVE — AB
STAPHYLOCOCCUS AUREUS: POSITIVE — AB

## 2013-03-12 LAB — URINE MICROSCOPIC-ADD ON

## 2013-03-12 LAB — CBC WITH DIFFERENTIAL/PLATELET
BASOS PCT: 1 % (ref 0–1)
Basophils Absolute: 0.1 10*3/uL (ref 0.0–0.1)
EOS ABS: 0.3 10*3/uL (ref 0.0–0.7)
Eosinophils Relative: 3 % (ref 0–5)
HCT: 33.9 % — ABNORMAL LOW (ref 39.0–52.0)
HEMOGLOBIN: 11.8 g/dL — AB (ref 13.0–17.0)
Lymphocytes Relative: 24 % (ref 12–46)
Lymphs Abs: 2.3 10*3/uL (ref 0.7–4.0)
MCH: 30.7 pg (ref 26.0–34.0)
MCHC: 34.8 g/dL (ref 30.0–36.0)
MCV: 88.3 fL (ref 78.0–100.0)
MONOS PCT: 5 % (ref 3–12)
Monocytes Absolute: 0.5 10*3/uL (ref 0.1–1.0)
NEUTROS ABS: 6.2 10*3/uL (ref 1.7–7.7)
Neutrophils Relative %: 67 % (ref 43–77)
Platelets: 277 10*3/uL (ref 150–400)
RBC: 3.84 MIL/uL — ABNORMAL LOW (ref 4.22–5.81)
RDW: 13.2 % (ref 11.5–15.5)
WBC: 9.3 10*3/uL (ref 4.0–10.5)

## 2013-03-12 LAB — URINALYSIS, ROUTINE W REFLEX MICROSCOPIC
BILIRUBIN URINE: NEGATIVE
Glucose, UA: NEGATIVE mg/dL
Ketones, ur: NEGATIVE mg/dL
Leukocytes, UA: NEGATIVE
Nitrite: NEGATIVE
Protein, ur: 100 mg/dL — AB
SPECIFIC GRAVITY, URINE: 1.006 (ref 1.005–1.030)
UROBILINOGEN UA: 0.2 mg/dL (ref 0.0–1.0)
pH: 6 (ref 5.0–8.0)

## 2013-03-12 LAB — BASIC METABOLIC PANEL
BUN: 79 mg/dL — AB (ref 6–23)
CHLORIDE: 100 meq/L (ref 96–112)
CO2: 20 meq/L (ref 19–32)
Calcium: 12.3 mg/dL — ABNORMAL HIGH (ref 8.4–10.5)
Creatinine, Ser: 6.8 mg/dL — ABNORMAL HIGH (ref 0.50–1.35)
GFR calc Af Amer: 11 mL/min — ABNORMAL LOW (ref >90)
GFR calc non Af Amer: 9 mL/min — ABNORMAL LOW (ref >90)
Glucose, Bld: 108 mg/dL — ABNORMAL HIGH (ref 70–99)
Potassium: 4.1 mEq/L (ref 3.7–5.3)
Sodium: 136 mEq/L — ABNORMAL LOW (ref 137–147)

## 2013-03-12 NOTE — Pre-Procedure Instructions (Signed)
Graylon GoodJason Wester  03/12/2013   Your procedure is scheduled on: Thursday, January 8th.  [facility], Main [facility]ance/Entrance "A" at 5:30 AM.  Call this number if you have problems the morning of surgery: [phone]   Remember:   Do not eat food or drink liquids after midnight, Wednesday January 7th.   Take these medicines the morning of surgery with A SIP OF WATER: amLODipine (NORVASC), famotidine (PEPCID).  Take if needed:oxyCODONE (OXY IR/ROXICODONE), cyclobenzaprine (FLEXERIL).              Do not wear jewelry, make-up or nail polish.  Do not wear lotions, powders, or perfumes. You may wear deodorant.   Men may shave face and neck.  Do not bring valuables to the hospital.  [facility] is not responsible for any belongings or valuables.               Contacts, dentures or bridgework may not be worn into surgery.  Leave suitcase in the car. After surgery it may be brought to your room.  For patients admitted to the hospital, discharge time is determined by your treatment team.               Patients discharged the day of surgery will not be allowed to drive home.  Name and phone number of your driver-   Special Instructions: Shower using CHG 2 nights before surgery and the night before surgery.  If you shower the day of surgery use CHG.  Use special wash - you have one bottle of CHG for all showers.  You should use approximately 1/3 of the bottle for each shower.   Please read over the following fact sheets that you were given: Pain Booklet, Coughing and Deep Breathing and Surgical Site Infection Prevention

## 2013-03-13 NOTE — Progress Notes (Signed)
Anesthesia Chart Review:  Patient is a 38 year old male scheduled for C5-6, C6-7 ACDF on 03/15/13 by Dr. Phoebe PerchHirsch.    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 PCP is Dr. Synetta Failaniel Jobe.  Nephrologist is Dr. Jaye BeagleJeanne Zekan who cleared patient from a renal standpoint. He is not yet on dialysis. [facility] by Cherylann RatelKitza Williams, NP on 03/05/13. Pre-transplant testing is currently on hold due to neck pain.  Prior to his neck pain, he reported that he was training to run a 5K.  EKG on 03/12/13 showed NSR, possible LAE, non-specific T wave abnormality.  Patient thought he had a prior echo at [facility] within the past three years.  Records requested, as available, but are still pending.  CXR on 03/12/13 showed no active cardiopulmonary disease.  Preoperative labs noted.  BUN/Cr 79/6.80. K 4.1.  H/H 11.8/33.9. (Renal notes indicate his baseline Cr is ~ 7.3.) Due to his advanced CKD history, will plan to get an ISTAT on arrival to ensure no issues with hyperkalemia.  He also needs coags (per surgeon orders) since they were not done at his PAT visit.  Short Stay nursing staff to follow-up if any additional records and have me review if any received.  His most recent nephrology and transplant notes are on his chart for review as needed.  He has been given nephrology clearance for this procedure.  Dr. Phoebe PerchHirsch can follow labs post-operatively and consult nephrology here if felt indicated.  If ISTAT results are acceptable on the day of surgery then I would anticipate that he could proceed as planned.  Velna Ochsllison Zelenak, PA-C [facility] Short Stay  Center/Anesthesiology Phone [phone] 03/13/2013 2:22 PM

## 2013-03-14 MED ORDER — CEFAZOLIN SODIUM-DEXTROSE 2-3 GM-% IV SOLR
2.0000 g | INTRAVENOUS | Status: AC
  Administered 2013-03-15: 2 g via INTRAVENOUS
  Filled 2013-03-14: qty 50

## 2013-03-15 ENCOUNTER — Encounter (HOSPITAL_COMMUNITY): Payer: 59 | Admitting: Vascular Surgery

## 2013-03-15 ENCOUNTER — Encounter (HOSPITAL_COMMUNITY)
Admission: RE | Disposition: A | Discharge status: Home / Self Care | Payer: Self-pay | Source: Ambulatory Visit | Attending: Neurosurgery

## 2013-03-15 ENCOUNTER — Encounter (HOSPITAL_COMMUNITY): Payer: Self-pay | Admitting: *Deleted

## 2013-03-15 ENCOUNTER — Inpatient Hospital Stay (HOSPITAL_COMMUNITY)
Admission: RE | Admit: 2013-03-15 | Discharge: 2013-03-16 | DRG: 472 | Disposition: A | Discharge status: Home / Self Care | Payer: 59 | Source: Ambulatory Visit | Attending: Neurosurgery | Admitting: Neurosurgery

## 2013-03-15 ENCOUNTER — Inpatient Hospital Stay (HOSPITAL_COMMUNITY): Payer: 59 | Admitting: Anesthesiology

## 2013-03-15 ENCOUNTER — Inpatient Hospital Stay (HOSPITAL_COMMUNITY): Payer: 59

## 2013-03-15 DIAGNOSIS — N2581 Secondary hyperparathyroidism of renal origin: Secondary | ICD-10-CM | POA: Diagnosis present

## 2013-03-15 DIAGNOSIS — G4700 Insomnia, unspecified: Secondary | ICD-10-CM | POA: Diagnosis present

## 2013-03-15 DIAGNOSIS — I120 Hypertensive chronic kidney disease with stage 5 chronic kidney disease or end stage renal disease: Secondary | ICD-10-CM | POA: Diagnosis present

## 2013-03-15 DIAGNOSIS — K219 Gastro-esophageal reflux disease without esophagitis: Secondary | ICD-10-CM | POA: Diagnosis present

## 2013-03-15 DIAGNOSIS — Z79899 Other long term (current) drug therapy: Secondary | ICD-10-CM

## 2013-03-15 DIAGNOSIS — N185 Chronic kidney disease, stage 5: Secondary | ICD-10-CM | POA: Diagnosis present

## 2013-03-15 DIAGNOSIS — E875 Hyperkalemia: Secondary | ICD-10-CM | POA: Diagnosis present

## 2013-03-15 DIAGNOSIS — Z9089 Acquired absence of other organs: Secondary | ICD-10-CM

## 2013-03-15 DIAGNOSIS — Q602 Renal agenesis, unspecified: Secondary | ICD-10-CM

## 2013-03-15 DIAGNOSIS — Z7682 Awaiting organ transplant status: Secondary | ICD-10-CM

## 2013-03-15 DIAGNOSIS — M47812 Spondylosis without myelopathy or radiculopathy, cervical region: Secondary | ICD-10-CM | POA: Diagnosis present

## 2013-03-15 DIAGNOSIS — M5020 Other cervical disc displacement, unspecified cervical region: Principal | ICD-10-CM | POA: Diagnosis present

## 2013-03-15 DIAGNOSIS — Q605 Renal hypoplasia, unspecified: Secondary | ICD-10-CM

## 2013-03-15 HISTORY — PX: ANTERIOR CERVICAL DECOMP/DISCECTOMY FUSION: SHX1161

## 2013-03-15 LAB — BASIC METABOLIC PANEL
BUN: 80 mg/dL — AB (ref 6–23)
CALCIUM: 9.6 mg/dL (ref 8.4–10.5)
CO2: 15 meq/L — AB (ref 19–32)
Chloride: 110 mEq/L (ref 96–112)
Creatinine, Ser: 6.92 mg/dL — ABNORMAL HIGH (ref 0.50–1.35)
GFR calc Af Amer: 11 mL/min — ABNORMAL LOW (ref >90)
GFR calc non Af Amer: 9 mL/min — ABNORMAL LOW (ref >90)
GLUCOSE: 126 mg/dL — AB (ref 70–99)
POTASSIUM: 5.5 meq/L — AB (ref 3.7–5.3)
SODIUM: 141 meq/L (ref 137–147)

## 2013-03-15 LAB — POCT I-STAT 4, (NA,K, GLUC, HGB,HCT)
GLUCOSE: 98 mg/dL (ref 70–99)
HCT: 32 % — ABNORMAL LOW (ref 39.0–52.0)
Hemoglobin: 10.9 g/dL — ABNORMAL LOW (ref 13.0–17.0)
Potassium: 4.4 mEq/L (ref 3.7–5.3)
Sodium: 143 mEq/L (ref 137–147)

## 2013-03-15 LAB — PROTIME-INR
INR: 1 (ref 0.00–1.49)
Prothrombin Time: 13 seconds (ref 11.6–15.2)

## 2013-03-15 LAB — ALBUMIN: Albumin: 3.1 g/dL — ABNORMAL LOW (ref 3.5–5.2)

## 2013-03-15 LAB — APTT: aPTT: 30 seconds (ref 24–37)

## 2013-03-15 LAB — PHOSPHORUS: Phosphorus: 4.7 mg/dL — ABNORMAL HIGH (ref 2.3–4.6)

## 2013-03-15 SURGERY — ANTERIOR CERVICAL DECOMPRESSION/DISCECTOMY FUSION 2 LEVELS
Anesthesia: General | Site: Neck

## 2013-03-15 MED ORDER — MORPHINE SULFATE 2 MG/ML IJ SOLN: 1.0000 mg | INTRAMUSCULAR | Status: DC | PRN

## 2013-03-15 MED ORDER — PROMETHAZINE HCL 25 MG/ML IJ SOLN: 12.5000 mg | INTRAMUSCULAR | Status: DC | PRN

## 2013-03-15 MED ORDER — GLYCOPYRROLATE 0.2 MG/ML IJ SOLN
INTRAMUSCULAR | Status: DC | PRN
  Administered 2013-03-15: 0.6 mg via INTRAVENOUS

## 2013-03-15 MED ORDER — SODIUM CHLORIDE 0.9 % IV SOLN
INTRAVENOUS | Status: DC | PRN
  Administered 2013-03-15: 07:00:00 via INTRAVENOUS

## 2013-03-15 MED ORDER — FENTANYL CITRATE 0.05 MG/ML IJ SOLN
INTRAMUSCULAR | Status: DC | PRN
  Administered 2013-03-15 (×3): 100 ug via INTRAVENOUS
  Administered 2013-03-15 (×3): 50 ug via INTRAVENOUS

## 2013-03-15 MED ORDER — OXYCODONE HCL 5 MG PO TABS
ORAL_TABLET | ORAL | Status: AC
  Filled 2013-03-15: qty 1

## 2013-03-15 MED ORDER — CEFAZOLIN SODIUM 1-5 GM-% IV SOLN
1.0000 g | Freq: Three times a day (TID) | INTRAVENOUS | Status: AC
  Administered 2013-03-15 (×2): 1 g via INTRAVENOUS
  Filled 2013-03-15 (×2): qty 50

## 2013-03-15 MED ORDER — ONDANSETRON HCL 4 MG/2ML IJ SOLN: 4.0000 mg | INTRAMUSCULAR | Status: DC | PRN

## 2013-03-15 MED ORDER — SODIUM CHLORIDE 0.9 % IJ SOLN
3.0000 mL | Freq: Two times a day (BID) | INTRAMUSCULAR | Status: DC
  Administered 2013-03-15: 3 mL via INTRAVENOUS

## 2013-03-15 MED ORDER — 0.9 % SODIUM CHLORIDE (POUR BTL) OPTIME
TOPICAL | Status: DC | PRN
  Administered 2013-03-15: 1000 mL

## 2013-03-15 MED ORDER — LIDOCAINE HCL (CARDIAC) 20 MG/ML IV SOLN
INTRAVENOUS | Status: DC | PRN
  Administered 2013-03-15: 100 mg via INTRAVENOUS

## 2013-03-15 MED ORDER — FEBUXOSTAT 40 MG PO TABS
40.0000 mg | ORAL_TABLET | Freq: Every day | ORAL | Status: DC
  Administered 2013-03-15: 40 mg via ORAL
  Filled 2013-03-15 (×2): qty 1

## 2013-03-15 MED ORDER — CHLORHEXIDINE GLUCONATE CLOTH 2 % EX PADS
6.0000 | MEDICATED_PAD | Freq: Every day | CUTANEOUS | Status: DC
  Administered 2013-03-16: 6 via TOPICAL

## 2013-03-15 MED ORDER — SODIUM CHLORIDE 0.9 % IV SOLN: 250.0000 mL | INTRAVENOUS | Status: DC

## 2013-03-15 MED ORDER — BISACODYL 10 MG RE SUPP: 10.0000 mg | Freq: Every day | RECTAL | Status: DC | PRN

## 2013-03-15 MED ORDER — PHENYLEPHRINE HCL 10 MG/ML IJ SOLN
INTRAMUSCULAR | Status: DC | PRN
  Administered 2013-03-15 (×2): 80 ug via INTRAVENOUS
  Administered 2013-03-15 (×2): 40 ug via INTRAVENOUS
  Administered 2013-03-15: 80 ug via INTRAVENOUS
  Administered 2013-03-15: 120 ug via INTRAVENOUS
  Administered 2013-03-15: 80 ug via INTRAVENOUS
  Administered 2013-03-15: 120 ug via INTRAVENOUS
  Administered 2013-03-15: 40 ug via INTRAVENOUS

## 2013-03-15 MED ORDER — LACTATED RINGERS IV SOLN: INTRAVENOUS | Status: DC

## 2013-03-15 MED ORDER — DOXAZOSIN MESYLATE 2 MG PO TABS
2.0000 mg | ORAL_TABLET | Freq: Every day | ORAL | Status: DC
  Administered 2013-03-15: 2 mg via ORAL
  Filled 2013-03-15 (×2): qty 1

## 2013-03-15 MED ORDER — HEMOSTATIC AGENTS (NO CHARGE) OPTIME
TOPICAL | Status: DC | PRN
  Administered 2013-03-15: 1 via TOPICAL

## 2013-03-15 MED ORDER — METHOCARBAMOL 500 MG PO TABS
500.0000 mg | ORAL_TABLET | Freq: Four times a day (QID) | ORAL | Status: DC | PRN
  Administered 2013-03-15 – 2013-03-16 (×4): 500 mg via ORAL
  Filled 2013-03-15 (×4): qty 1

## 2013-03-15 MED ORDER — FAMOTIDINE 20 MG PO TABS
20.0000 mg | ORAL_TABLET | Freq: Two times a day (BID) | ORAL | Status: DC | PRN
  Filled 2013-03-15: qty 1

## 2013-03-15 MED ORDER — OXYCODONE HCL 5 MG/5ML PO SOLN: 5.0000 mg | Freq: Once | ORAL | Status: AC | PRN

## 2013-03-15 MED ORDER — CINACALCET HCL 30 MG PO TABS
90.0000 mg | ORAL_TABLET | Freq: Every day | ORAL | Status: DC
  Administered 2013-03-16: 90 mg via ORAL
  Filled 2013-03-15 (×2): qty 3

## 2013-03-15 MED ORDER — SODIUM BICARBONATE 650 MG PO TABS
650.0000 mg | ORAL_TABLET | Freq: Four times a day (QID) | ORAL | Status: DC
  Administered 2013-03-15 (×3): 650 mg via ORAL
  Filled 2013-03-15 (×7): qty 1

## 2013-03-15 MED ORDER — AMLODIPINE BESYLATE 10 MG PO TABS
10.0000 mg | ORAL_TABLET | Freq: Every day | ORAL | Status: DC
  Filled 2013-03-15 (×2): qty 1

## 2013-03-15 MED ORDER — LIDOCAINE-EPINEPHRINE 1 %-1:100000 IJ SOLN
INTRAMUSCULAR | Status: DC | PRN
  Administered 2013-03-15: 10 mL

## 2013-03-15 MED ORDER — METOCLOPRAMIDE HCL 5 MG/ML IJ SOLN: 10.0000 mg | Freq: Once | INTRAMUSCULAR | Status: DC | PRN

## 2013-03-15 MED ORDER — HYDROMORPHONE HCL PF 1 MG/ML IJ SOLN
INTRAMUSCULAR | Status: AC
  Filled 2013-03-15: qty 1

## 2013-03-15 MED ORDER — MUPIROCIN 2 % EX OINT: 1.0000 "application " | TOPICAL_OINTMENT | Freq: Two times a day (BID) | CUTANEOUS | Status: DC

## 2013-03-15 MED ORDER — DEXAMETHASONE SODIUM PHOSPHATE 4 MG/ML IJ SOLN
INTRAMUSCULAR | Status: DC | PRN
  Administered 2013-03-15: 8 mg via INTRAVENOUS

## 2013-03-15 MED ORDER — PROPOFOL 10 MG/ML IV BOLUS
INTRAVENOUS | Status: DC | PRN
  Administered 2013-03-15: 200 mg via INTRAVENOUS

## 2013-03-15 MED ORDER — NEOSTIGMINE METHYLSULFATE 1 MG/ML IJ SOLN
INTRAMUSCULAR | Status: DC | PRN
  Administered 2013-03-15: 5 mg via INTRAVENOUS

## 2013-03-15 MED ORDER — ARTIFICIAL TEARS OP OINT
TOPICAL_OINTMENT | OPHTHALMIC | Status: DC | PRN
  Administered 2013-03-15: 1 via OPHTHALMIC

## 2013-03-15 MED ORDER — HYDROMORPHONE HCL PF 1 MG/ML IJ SOLN
0.2500 mg | INTRAMUSCULAR | Status: DC | PRN
  Administered 2013-03-15 (×2): 0.5 mg via INTRAVENOUS

## 2013-03-15 MED ORDER — OXYCODONE HCL 5 MG PO TABS
5.0000 mg | ORAL_TABLET | Freq: Once | ORAL | Status: AC | PRN
  Administered 2013-03-15: 5 mg via ORAL

## 2013-03-15 MED ORDER — ONDANSETRON HCL 4 MG/2ML IJ SOLN
INTRAMUSCULAR | Status: DC | PRN
  Administered 2013-03-15: 4 mg via INTRAVENOUS

## 2013-03-15 MED ORDER — CINACALCET HCL 30 MG PO TABS
60.0000 mg | ORAL_TABLET | Freq: Every day | ORAL | Status: DC
  Filled 2013-03-15: qty 2

## 2013-03-15 MED ORDER — CINACALCET HCL 30 MG PO TABS
60.0000 mg | ORAL_TABLET | Freq: Every day | ORAL | Status: DC
  Filled 2013-03-15 (×2): qty 2

## 2013-03-15 MED ORDER — ACETAMINOPHEN 650 MG RE SUPP: 650.0000 mg | RECTAL | Status: DC | PRN

## 2013-03-15 MED ORDER — OXYCODONE HCL 5 MG PO TABS
5.0000 mg | ORAL_TABLET | ORAL | Status: DC | PRN
  Administered 2013-03-15 – 2013-03-16 (×4): 10 mg via ORAL
  Filled 2013-03-15 (×4): qty 2

## 2013-03-15 MED ORDER — ROCURONIUM BROMIDE 100 MG/10ML IV SOLN
INTRAVENOUS | Status: DC | PRN
  Administered 2013-03-15: 50 mg via INTRAVENOUS
  Administered 2013-03-15: 10 mg via INTRAVENOUS

## 2013-03-15 MED ORDER — SODIUM CHLORIDE 0.9 % IR SOLN
Status: DC | PRN
  Administered 2013-03-15: 08:00:00

## 2013-03-15 MED ORDER — DOCUSATE SODIUM 100 MG PO CAPS
100.0000 mg | ORAL_CAPSULE | Freq: Two times a day (BID) | ORAL | Status: DC
  Administered 2013-03-15 (×2): 100 mg via ORAL
  Filled 2013-03-15 (×4): qty 1

## 2013-03-15 MED ORDER — CYCLOBENZAPRINE HCL 10 MG PO TABS: 5.0000 mg | ORAL_TABLET | Freq: Three times a day (TID) | ORAL | Status: DC | PRN

## 2013-03-15 MED ORDER — MAGNESIUM HYDROXIDE 400 MG/5ML PO SUSP: 30.0000 mL | Freq: Every day | ORAL | Status: DC | PRN

## 2013-03-15 MED ORDER — METHOCARBAMOL 100 MG/ML IJ SOLN
500.0000 mg | Freq: Four times a day (QID) | INTRAMUSCULAR | Status: DC | PRN
  Filled 2013-03-15: qty 5

## 2013-03-15 MED ORDER — SODIUM CHLORIDE 0.9 % IJ SOLN: 3.0000 mL | INTRAMUSCULAR | Status: DC | PRN

## 2013-03-15 MED ORDER — MIDAZOLAM HCL 5 MG/5ML IJ SOLN
INTRAMUSCULAR | Status: DC | PRN
  Administered 2013-03-15: 2 mg via INTRAVENOUS

## 2013-03-15 MED ORDER — HYDROCODONE-ACETAMINOPHEN 5-325 MG PO TABS: 1.0000 | ORAL_TABLET | ORAL | Status: DC | PRN

## 2013-03-15 MED ORDER — THROMBIN 5000 UNITS EX SOLR
CUTANEOUS | Status: DC | PRN
  Administered 2013-03-15 (×2): 5000 [IU] via TOPICAL

## 2013-03-15 MED ORDER — PROMETHAZINE HCL 25 MG PO TABS: 12.5000 mg | ORAL_TABLET | ORAL | Status: DC | PRN

## 2013-03-15 MED ORDER — ACETAMINOPHEN 325 MG PO TABS: 650.0000 mg | ORAL_TABLET | ORAL | Status: DC | PRN

## 2013-03-15 SURGICAL SUPPLY — 55 items
ALLOGRAFT LORDOTIC CC 7X11X14 (Bone Implant) ×3 IMPLANT
ALLOGRAFT TRIAD 8X11X14MM (Bone Implant) ×3 IMPLANT
BAG DECANTER FOR FLEXI CONT (MISCELLANEOUS) ×3 IMPLANT
BANDAGE GAUZE ELAST BULKY 4 IN (GAUZE/BANDAGES/DRESSINGS) ×6 IMPLANT
BENZOIN TINCTURE PRP APPL 2/3 (GAUZE/BANDAGES/DRESSINGS) ×3 IMPLANT
BIT DRILL 14MM (INSTRUMENTS) ×1 IMPLANT
BUR MATCHSTICK NEURO 3.0 LAGG (BURR) ×3 IMPLANT
CANISTER SUCT 3000ML (MISCELLANEOUS) ×3 IMPLANT
CLOSURE WOUND 1/2 X4 (GAUZE/BANDAGES/DRESSINGS) ×1
CONT SPEC 4OZ CLIKSEAL STRL BL (MISCELLANEOUS) ×3 IMPLANT
DRAPE LAPAROTOMY 100X72 PEDS (DRAPES) ×3 IMPLANT
DRAPE MICROSCOPE LEICA (MISCELLANEOUS) ×3 IMPLANT
DRAPE POUCH INSTRU U-SHP 10X18 (DRAPES) ×3 IMPLANT
DRILL 14MM (INSTRUMENTS) ×3
DRSG OPSITE POSTOP 3X4 (GAUZE/BANDAGES/DRESSINGS) ×3 IMPLANT
DURAPREP 6ML APPLICATOR 50/CS (WOUND CARE) ×3 IMPLANT
ELECT REM PT RETURN 9FT ADLT (ELECTROSURGICAL) ×3
ELECTRODE REM PT RTRN 9FT ADLT (ELECTROSURGICAL) ×1 IMPLANT
GAUZE SPONGE 4X4 16PLY XRAY LF (GAUZE/BANDAGES/DRESSINGS) IMPLANT
GLOVE BIOGEL PI IND STRL 7.5 (GLOVE) ×2 IMPLANT
GLOVE BIOGEL PI INDICATOR 7.5 (GLOVE) ×4
GLOVE ECLIPSE 7.5 STRL STRAW (GLOVE) ×3 IMPLANT
GLOVE ECLIPSE 8.0 STRL XLNG CF (GLOVE) ×3 IMPLANT
GLOVE EXAM NITRILE LRG STRL (GLOVE) IMPLANT
GLOVE EXAM NITRILE MD LF STRL (GLOVE) IMPLANT
GLOVE EXAM NITRILE XL STR (GLOVE) IMPLANT
GLOVE EXAM NITRILE XS STR PU (GLOVE) IMPLANT
GLOVE SURG SS PI 8.0 STRL IVOR (GLOVE) ×6 IMPLANT
GOWN BRE IMP SLV AUR LG STRL (GOWN DISPOSABLE) IMPLANT
GOWN BRE IMP SLV AUR XL STRL (GOWN DISPOSABLE) IMPLANT
GOWN STRL REIN 2XL LVL4 (GOWN DISPOSABLE) IMPLANT
HEAD HALTER (SOFTGOODS) ×3 IMPLANT
KIT BASIN OR (CUSTOM PROCEDURE TRAY) ×3 IMPLANT
KIT ROOM TURNOVER OR (KITS) ×3 IMPLANT
NEEDLE HYPO 22GX1.5 SAFETY (NEEDLE) IMPLANT
NEEDLE HYPO 25X1 1.5 SAFETY (NEEDLE) ×3 IMPLANT
NEEDLE SPNL 20GX3.5 QUINCKE YW (NEEDLE) ×3 IMPLANT
NS IRRIG 1000ML POUR BTL (IV SOLUTION) ×3 IMPLANT
PACK LAMINECTOMY NEURO (CUSTOM PROCEDURE TRAY) ×3 IMPLANT
PAD ARMBOARD 7.5X6 YLW CONV (MISCELLANEOUS) ×9 IMPLANT
PATTIES SURGICAL .75X.75 (GAUZE/BANDAGES/DRESSINGS) ×3 IMPLANT
PIN DISTRACTION 14MM (PIN) ×6 IMPLANT
PLATE 32MM (Plate) ×3 IMPLANT
RUBBERBAND STERILE (MISCELLANEOUS) ×6 IMPLANT
SCREW 14MM (Screw) ×12 IMPLANT
SPONGE GAUZE 4X4 12PLY (GAUZE/BANDAGES/DRESSINGS) ×3 IMPLANT
SPONGE INTESTINAL PEANUT (DISPOSABLE) ×3 IMPLANT
STRIP CLOSURE SKIN 1/2X4 (GAUZE/BANDAGES/DRESSINGS) ×2 IMPLANT
SUT VIC AB 3-0 SH 8-18 (SUTURE) ×6 IMPLANT
SYR 20ML ECCENTRIC (SYRINGE) ×3 IMPLANT
TAPE CLOTH SURG 4X10 WHT LF (GAUZE/BANDAGES/DRESSINGS) ×3 IMPLANT
TAPE STRIPS DRAPE STRL (GAUZE/BANDAGES/DRESSINGS) ×3 IMPLANT
TOWEL OR 17X24 6PK STRL BLUE (TOWEL DISPOSABLE) ×3 IMPLANT
TOWEL OR 17X26 10 PK STRL BLUE (TOWEL DISPOSABLE) ×3 IMPLANT
WATER STERILE IRR 1000ML POUR (IV SOLUTION) ×3 IMPLANT

## 2013-03-15 NOTE — Transfer of Care (Signed)
Immediate Anesthesia Transfer of Care Note  Patient: Mitchell Bryant  Procedure(s) Performed: Procedure(s) with comments: ANTERIOR CERVICAL DECOMPRESSION/DISCECTOMY FUSION 2 LEVELS (N/A) - C5-6 C6-7 Anterior cervical decompression/diskectomy/fusion  Patient Location: PACU  Anesthesia Type:General  Level of Consciousness: awake, alert , oriented and patient cooperative  Airway & Oxygen Therapy: Patient Spontanous Breathing  Post-op Assessment: Report given to PACU RN, Post -op Vital signs reviewed and stable and Patient moving all extremities X 4  Post vital signs: Reviewed and stable  Complications: No apparent anesthesia complications

## 2013-03-15 NOTE — Progress Notes (Signed)
I have personally seen and examined this patient and agree with the assessment/plan as outlined above by [facility] (PGY 2). Will continue to monitor Mitchell Bryant while here and assist with the care of his CKD 5. He has mild hyperkalemia and metabolic acidosis for which his sodium bicarbonate has been restarted. Updated sensipar dose. Amlodipine and doxazosin restarted. Mitchell Bessire K.,MD 03/15/2013 1:08 PM

## 2013-03-15 NOTE — Anesthesia Postprocedure Evaluation (Signed)
Anesthesia Post Note  Patient: Mitchell Bryant  Procedure(s) Performed: Procedure(s) (LRB): ANTERIOR CERVICAL DECOMPRESSION/DISCECTOMY FUSION 2 LEVELS (N/A)  Anesthesia type: General  Patient location: PACU  Post pain: Pain level controlled  Post assessment: Patient's Cardiovascular Status Stable  Last Vitals:  Filed Vitals:   03/15/13 1053  BP: 157/81  Pulse: 110  Temp:   Resp: 14    Post vital signs: Reviewed and stable  Level of consciousness: alert  Complications: No apparent anesthesia complications

## 2013-03-15 NOTE — Anesthesia Preprocedure Evaluation (Signed)
Anesthesia Evaluation  Patient identified by MRN, date of birth, ID band Patient awake    Reviewed: Allergy & Precautions, H&P , NPO status , Patient's Chart, lab work & pertinent test results, reviewed documented beta blocker date and time   Airway Mallampati: II TM Distance: >3 FB Neck ROM: full    Dental   Pulmonary neg pulmonary ROS,  breath sounds clear to auscultation        Cardiovascular hypertension, negative cardio ROS  Rhythm:regular     Neuro/Psych negative psych ROS   GI/Hepatic negative GI ROS, Neg liver ROS, PUD, GERD-  Medicated and Controlled,  Endo/Other  negative endocrine ROS  Renal/GU CRFRenal disease  negative genitourinary   Musculoskeletal   Abdominal   Peds  Hematology negative hematology ROS (+)   Anesthesia Other Findings See surgeon's H&P   Reproductive/Obstetrics negative OB ROS                           Anesthesia Physical Anesthesia Plan  ASA: III  Anesthesia Plan: General   Post-op Pain Management:    Induction: Intravenous  Airway Management Planned: Oral ETT  Additional Equipment:   Intra-op Plan:   Post-operative Plan: Extubation in OR  Informed Consent: I have reviewed the patients History and Physical, chart, labs and discussed the procedure including the risks, benefits and alternatives for the proposed anesthesia with the patient or authorized representative who has indicated his/her understanding and acceptance.   Dental Advisory Given  Plan Discussed with: CRNA and Surgeon  Anesthesia Plan Comments:         Anesthesia Quick Evaluation

## 2013-03-15 NOTE — Progress Notes (Signed)
Utilization review completed.  

## 2013-03-15 NOTE — H&P (Signed)
See H& P.

## 2013-03-15 NOTE — Op Note (Signed)
03/15/2013  9:54 AM  PATIENT:  Mitchell Bryant  38 y.o. male  PRE-OPERATIVE DIAGNOSIS:  Cervical herniated disc with radiculopathy, spondylosis, C5-6, C6-7  POST-OPERATIVE DIAGNOSIS: same  PROCEDURE:  Procedure(s): ANTERIOR CERVICAL DECOMPRESSION/DISCECTOMY FUSION 2 LEVELS, C5-6, C6-7 , structural allograft, trestle anterior cervical plate  SURGEON:  Surgeon(s): Clydene FakeJames R Brittanni Cariker, MD Rolanda Lundborgandy O Kritzer, MD-assist    ANESTHESIA:   general  EBL:  Total I/O In: -  Out: 150 [Blood:150]  BLOOD ADMINISTERED:none  DRAINS: none   SPECIMEN:  No Specimen  DICTATION: Patient with left arm pain numbness weakness not improving with steroids MRI was done showing spinal change Vicryl 956 and spinal change in a large disc herniation left-sided C6-7 patient was evaluated by the nephrologist and cleared for surgery the construct result proceed with surgical intervention to [facility].  Patient brought in from general anesthesia induced patient placed in 10 pounds halter traction prepped draped sterile fashion segments inject with 10 cc morcellized with epinephrine incision was then made from the trochanter border of the sternocleidomastoid muscle the left-sided neck incision taken of the platysma hemostasis obtained with Bovie cauterization the platysma was incised the Bovie and blunt dissection taken to the intracerebral fascia to the anterior cervical spine. Needles placed interspace an x-rays attention needle was at the 56 level do space was incised with a 15 blade and partial discectomy done with pituitary rongeurs as the needle was removed. Lungs: Muscles reflected laterally using the Bovie from C5-7 and soaking retractors were placed into the 56 and 67 disc spaces were incised with 15 blade and discectomy started pituitary rongeurs and curettes contrast which removed with Kerrison punches and distraction pins placed in C5 and C7 interspace distracted microscope was brought in for microdissection starting at the  C6 level discectomy continued with curettes and 1 and 2 mm Kerrison punches were used to remove posterior disc osteophyte and ligament. Large fragment of disc was seen to the left side impinging into the lateral side canal and nerve root these were and this was removed and there were finished we did decompression the central canal and bilateral nerve roots. We is high-speed drill to remove callus endplate measured height a displaced to be 8 mm it hemostasis with Gelfoam thrombin this is. Dark made over the 8 mm structural allograft bone was tapped in place.  Attention then taken to the 56 level a high-speed drill was used to remove callus endplate some osteophytes and went to more Kerrison punches were used to remove posterior disc osteophyte and ligament decompressed the central canal bilateral foraminotomies were performed. Do space was admitted and a 7 mm structural her postop place in good hemostasis with Gelfoam thrombin was irrigated out. We then removed the distraction distraction pins and we the traction a bone plugs were in good firm position we. About solution we very good hemostasis a Tressel anterior cervical plate was placed with anterior cervical spine 2 screws placed in C5-C6 and C7 these were final tightened lateral x-rays obtained showing good position plate-screw bone plugs at the 56 and 67 level. Retractors removed we. About solution had very good hemostasis the platysma closed through Vicryl interrupted sutures subcutaneous tissue closed same skin closed benzoin Steri-Strips dressing was placed patient saucer color woken from anesthesia and transferred to recovery room.  PLAN OF CARE: Admit to inpatient   PATIENT DISPOSITION:  PACU - hemodynamically stable.

## 2013-03-15 NOTE — Interval H&P Note (Signed)
History and Physical Interval Note:  03/15/2013 7:35 AM  Mitchell Bryant  has presented today for surgery, with the diagnosis of Cervical herniated disc  The various methods of treatment have been discussed with the patient and family. After consideration of risks, benefits and other options for treatment, the patient has consented to  Procedure(s) with comments: ANTERIOR CERVICAL DECOMPRESSION/DISCECTOMY FUSION 2 LEVELS (N/A) - C5-6 C6-7 Anterior cervical decompression/diskectomy/fusion as a surgical intervention .  The patient's history has been reviewed, patient examined, no change in status, stable for surgery.  I have reviewed the patient's chart and labs.  Questions were answered to the patient's satisfaction.     Marthella Osorno R

## 2013-03-15 NOTE — Preoperative (Signed)
Beta Blockers   Reason not to administer Beta Blockers:Not Applicable 

## 2013-03-15 NOTE — Progress Notes (Signed)
[facility]  Patient name: Mitchell Bryant Weimann Medical record number: [id number] Date of birth: 07/12/1975 Age: 38 y.o. Gender: male  Primary Care Provider: Malka SoJOBE,DANIEL B., MD  Consult: Management of CKD  History of Present Illness: Mitchell Bryant Bohr is a 38 y.o. male s/p cervical fusion C5-C7 today. Patient has a history of hypertension, hyperparathyroidism and CKD. He endorses congenital solitary kidney and as a child experienced insult, by trauma, to his remaining kidney.  Patients CKD is [facility]. He reports his Scr baseline is ~6.5. He has a history of hyperparathyroidism and is prescribed sensipar, which he states was just recently increased from 60 mg to 90 mg daily. He follows a renal diet closely at home, but is not water restricted. He take bicarb 650 mg QID. He has never needed HD in the past. He produces > 5L daily in urine. No diuretic or NSAID use. His HTN is controlled at home with amlodipine and Cardura. We are consulted today to manage his CKD during this admission. Per surgery, he is likely to go home tomorrow if stable.   Review Of Systems: Per HPI Otherwise 12 point review of systems was performed and was unremarkable.  Patient Active Problem List   Diagnosis Date Noted  . HNP (herniated nucleus pulposus), cervical 03/15/2013  . Right foot pain 06/19/2012   Past Medical History: Past Medical History  Diagnosis Date  . Kidney failure     Pt has one kidney functioning at 12%.  Is on Transplant list.  . Hypertension   . Hyperparathyroidism   . Hyperuricemia   . Insomnia     due to pain  . GERD (gastroesophageal reflux disease)   . Constipation   . History of blood transfusion     due to GI Bleed  . Gastric ulcer 2004    hx of - bleed   Past Surgical History: Past Surgical History  Procedure Laterality Date  . Appendectomy    . Kidney surgery      biospy   Social History: History  Substance Use Topics  . Smoking status: Never  Smoker   . Smokeless tobacco: Not on file  . Alcohol Use: No    Please also refer to relevant sections of EMR.  Family History: History reviewed. No pertinent family history. Allergies and Medications: No Known Allergies No current facility-administered medications on file prior to encounter.   Current Outpatient Prescriptions on File Prior to Encounter  Medication Sig Dispense Refill  . amLODipine (NORVASC) 10 MG tablet Take 10 mg by mouth daily.      . B Complex-C-Folic Acid (RENO CAPS) 1 MG CAPS       . cyclobenzaprine (FLEXERIL) 5 MG tablet Take 1 tablet (5 mg total) by mouth 3 (three) times daily as needed for muscle spasms.  15 tablet  0  . doxazosin (CARDURA) 2 MG tablet Take 2 mg by mouth at bedtime.      . Multiple Vitamin (MULTIVITAMIN) tablet Take 1 tablet by mouth daily. Renal supplement      . oxyCODONE (OXY IR/ROXICODONE) 5 MG immediate release tablet Take 1-2 tablets (5-10 mg total) by mouth every 4 (four) hours as needed for severe pain or breakthrough pain.  15 tablet  0  . sodium bicarbonate 650 MG tablet Take 650 mg by mouth 4 (four) times daily.       . famotidine (PEPCID) 20 MG tablet Take 1 tablet (20 mg total) by mouth 2 (two) times daily as needed for  heartburn (upset stomach).  20 tablet  0    Objective: BP 157/81  Pulse 110  Temp(Src) 97.8 F (36.6 C) (Oral)  Resp 14  SpO2 97%  Intake/Output Summary (Last 24 hours) at 03/15/13 1122 Last data filed at 03/15/13 1034  Gross per 24 hour  Intake    700 ml  Output    150 ml  Net    550 ml    Exam: General: NAD. Sitting up in bed, mildly sedated from surgery.  Cardiovascular: Tachycardic.  Respiratory: CTAB Abdomen: Soft. NTND. Hypo-active BS.  Extremities: No erythema or edema. SCD's in place and functioning.   Labs and Imaging: CBC BMET   Recent Labs Lab 03/12/13 1356 03/15/13 0608  WBC 9.3  --   HGB 11.8* 10.9*  HCT 33.9* 32.0*  PLT 277  --     Recent Labs Lab 03/12/13 1356  03/15/13 0608  NA 136* 143  K 4.1 4.4  CL 100  --   CO2 20  --   BUN 79*  --   CREATININE 6.80*  --   GLUCOSE 108* 98  CALCIUM 12.3*  --      Urinalysis    Component Value Date/Time   COLORURINE YELLOW 03/12/2013 1355   APPEARANCEUR CLEAR 03/12/2013 1355   LABSPEC 1.006 03/12/2013 1355   PHURINE 6.0 03/12/2013 1355   GLUCOSEU NEGATIVE 03/12/2013 1355   HGBUR SMALL* 03/12/2013 1355   BILIRUBINUR NEGATIVE 03/12/2013 1355   KETONESUR NEGATIVE 03/12/2013 1355   PROTEINUR 100* 03/12/2013 1355   UROBILINOGEN 0.2 03/12/2013 1355   NITRITE NEGATIVE 03/12/2013 1355   LEUKOCYTESUR NEGATIVE 03/12/2013 1355    Dg Cervical Spine 2-3 Views  03/15/2013   CLINICAL DATA:  Portable lateral cervical spine images from a cervical fusion.  EXAM: CERVICAL SPINE - 2-3 VIEW  COMPARISON:  02/05/2013  FINDINGS: Initial image shows placement of a surgical needle/probe with its tip superimposed over the central C5-C6 disc. Followup image shows placement of an anterior fusion plate and associated fixation screws spanning C5-C7 with bone graft material maintaining disc height. The fusion hardware and bone graft material are well-seated. No acute fracture or evidence of an operative complication.  IMPRESSION: Anterior cervical spine fusion from C5-C7 as described.   Electronically Signed   By: Amie Portland M.D.   On: 03/15/2013 09:58   Assessment: 1.) CKD: Pt managed by [facility]. Baseline Scr appears to be ~6.5. Scr last assessed on 1/5 (6.8). Pt is on transplant list.  2.) HTN: Amlodipine and Cardura 3.) Hyperparathyroidism: h/o of hyperparathyroidism. Prescribed 90 mg QD Sensipar.   Plan:   1.) Renal function panel today and in the AM  2.) Monitor BP- Continue Home meds 3.) Continue Sensipar at 90 mg QD 4.) Continue Bicarb at 650 mg QD 5.) Renal diet, without fluid restriction when tolerating PO   Natalia Leatherwood, DO 03/15/2013, 11:21 AM PGY-2

## 2013-03-16 LAB — CBC
HCT: 29.6 % — ABNORMAL LOW (ref 39.0–52.0)
Hemoglobin: 10.1 g/dL — ABNORMAL LOW (ref 13.0–17.0)
MCH: 30.6 pg (ref 26.0–34.0)
MCHC: 34.1 g/dL (ref 30.0–36.0)
MCV: 89.7 fL (ref 78.0–100.0)
PLATELETS: 252 10*3/uL (ref 150–400)
RBC: 3.3 MIL/uL — AB (ref 4.22–5.81)
RDW: 13 % (ref 11.5–15.5)
WBC: 12.6 10*3/uL — AB (ref 4.0–10.5)

## 2013-03-16 LAB — BASIC METABOLIC PANEL
BUN: 76 mg/dL — ABNORMAL HIGH (ref 6–23)
CALCIUM: 11.3 mg/dL — AB (ref 8.4–10.5)
CO2: 19 mEq/L (ref 19–32)
CREATININE: 6.78 mg/dL — AB (ref 0.50–1.35)
Chloride: 107 mEq/L (ref 96–112)
GFR calc Af Amer: 11 mL/min — ABNORMAL LOW (ref >90)
GFR calc non Af Amer: 9 mL/min — ABNORMAL LOW (ref >90)
GLUCOSE: 117 mg/dL — AB (ref 70–99)
Potassium: 4.8 mEq/L (ref 3.7–5.3)
SODIUM: 140 meq/L (ref 137–147)

## 2013-03-16 MED ORDER — OXYCODONE-ACETAMINOPHEN 5-325 MG PO TABS: 1.0000 | ORAL_TABLET | ORAL | Status: DC | PRN

## 2013-03-16 NOTE — Progress Notes (Signed)
Pt and wife given D/C instructions with Rx's, verbal understanding was given. Pt D/C'd home via walking @ 1110 per MD order. Pt stable @ D/C and has no other needs @ this time. Rema FendtAshley Jayvyn Haselton, RN

## 2013-03-16 NOTE — Progress Notes (Signed)
Patient ID: Mitchell Bryant, male   DOB: 12/06/1975, 38 y.o.   MRN: [id number] [facility]  Patient name: Mitchell Bryant Medical record number: [id number] Date of birth: 06/01/1975 Age: 38 y.o. Gender: male  Primary Care Provider: Malka SoJOBE,DANIEL B., MD Consulted: Management of CKD   Subjective: Patient doing well. Would like to go home today. Having difficulty eating, d/t sore throat.   Objective: Temp:  [97.5 F (36.4 C)-98.5 F (36.9 C)] 97.7 F (36.5 C) (01/09 0822) Pulse Rate:  [97-124] 106 (01/09 0822) Resp:  [14-20] 18 (01/09 0822) BP: (140-168)/(72-93) 168/85 mmHg (01/09 0822) SpO2:  [94 %-99 %] 98 % (01/09 78290822)  Exam:  General: NAD. Sitting up in bed, mildly sedated from surgery.  Cardiovascular: Tachycardic. 2/6 SM Respiratory: CTAB  Abdomen: Soft. NTND. Hypo-active BS.  Extremities: No erythema or edema.   Laboratory:  Recent Labs Lab 03/12/13 1356 03/15/13 0608  WBC 9.3  --   HGB 11.8* 10.9*  HCT 33.9* 32.0*  PLT 277  --     Recent Labs Lab 03/12/13 1356 03/15/13 0608 03/15/13 1040 03/16/13 0348  NA 136* 143 141 140  K 4.1 4.4 5.5* 4.8  CL 100  --  110 107  CO2 20  --  15* 19  BUN 79*  --  80* 76*  CREATININE 6.80*  --  6.92* 6.78*  CALCIUM 12.3*  --  9.6 11.3*  GLUCOSE 108* 98 126* 117*    Assessment:  1.) CKD: Pt managed by [facility]. Baseline Scr appears to be ~6.5. Scr last assessed on 1/5 (6.8). Pt is on transplant list.  2.) HTN: Amlodipine and Cardura  3.) Hyperparathyroidism: h/o of hyperparathyroidism. Prescribed 90 mg QD Sensipar.   Plan:   2.) Monitor BP- Continue Home meds  3.) Continue Sensipar at 90 mg QD  4.) Continue Bicarb at 650 mg QD  5.) Renal diet, without fluid restriction when tolerating PO   Natalia Leatherwoodenee A Kuneff, DO 03/16/2013, 8:23 AM PGY-2 I have seen and examined this patient and agree with plan per Dr Claiborne BillingsKuneff.  Renal fx stable with plans for DC per Neurosurgery.  He will FU with his primary nephrologist next  week.  Rayonna Heldman T,MD 03/16/2013 9:50 AM

## 2013-03-16 NOTE — Discharge Summary (Signed)
Physician Discharge Summary  Patient ID: Mitchell Bryant MRN: [id number] DOB/AGE: 38/03/1975 37 y.o.  Admit date: 03/15/2013 Discharge date: 03/16/2013  Admission Diagnoses:Cervical herniated disc with radiculopathy, spondylosis, C5-6, C6-7      Discharge Diagnoses: Cervical herniated disc with radiculopathy, spondylosis, C5-6, C6-7     Active Problems:   HNP (herniated nucleus pulposus), cervical   Discharged Condition: good  Hospital Course: pt admitted on day of surgery  - underwent procedure below  - pt with less arm pain , numbness, improved strength.. , ambulating, voiding, tolerating PO, voice ok  Consults: nephrology  Significant Diagnostic Studies: labs:  Results for orders placed during the hospital encounter of 03/15/13 (from the past 24 hour(s))  BASIC METABOLIC PANEL     Status: Abnormal   Collection Time    03/15/13 10:40 AM      Result Value Range   Sodium 141  137 - 147 mEq/L   Potassium 5.5 (*) 3.7 - 5.3 mEq/L   Chloride 110  96 - 112 mEq/L   CO2 15 (*) 19 - 32 mEq/L   Glucose, Bld 126 (*) 70 - 99 mg/dL   BUN 80 (*) 6 - 23 mg/dL   Creatinine, Ser 7.826.92 (*) 0.50 - 1.35 mg/dL   Calcium 9.6  8.4 - 95.610.5 mg/dL   GFR calc non Af Amer 9 (*) >90 mL/min   GFR calc Af Amer 11 (*) >90 mL/min  ALBUMIN     Status: Abnormal   Collection Time    03/15/13 11:30 AM      Result Value Range   Albumin 3.1 (*) 3.5 - 5.2 g/dL  PHOSPHORUS     Status: Abnormal   Collection Time    03/15/13 11:30 AM      Result Value Range   Phosphorus 4.7 (*) 2.3 - 4.6 mg/dL  BASIC METABOLIC PANEL     Status: Abnormal   Collection Time    03/16/13  3:48 AM      Result Value Range   Sodium 140  137 - 147 mEq/L   Potassium 4.8  3.7 - 5.3 mEq/L   Chloride 107  96 - 112 mEq/L   CO2 19  19 - 32 mEq/L   Glucose, Bld 117 (*) 70 - 99 mg/dL   BUN 76 (*) 6 - 23 mg/dL   Creatinine, Ser 2.136.78 (*) 0.50 - 1.35 mg/dL   Calcium 08.611.3 (*) 8.4 - 10.5 mg/dL   GFR calc non Af Amer 9 (*) >90 mL/min   GFR  calc Af Amer 11 (*) >90 mL/min    Treatments: surgery: ANTERIOR CERVICAL DECOMPRESSION/DISCECTOMY FUSION 2 LEVELS, C5-6, C6-7 , structural allograft, trestle anterior cervical plate      Discharge Exam: Blood pressure 168/85, pulse 106, temperature 97.7 F (36.5 C), temperature source Oral, resp. rate 18, SpO2 98.00%. Wound:c/d/i  Disposition: home     Medication List    TAKE these medications       amLODipine 10 MG tablet  Commonly known as:  NORVASC  Take 10 mg by mouth daily.     cinacalcet 60 MG tablet  Commonly known as:  SENSIPAR  Take 60 mg by mouth daily.     cyclobenzaprine 5 MG tablet  Commonly known as:  FLEXERIL  Take 1 tablet (5 mg total) by mouth 3 (three) times daily as needed for muscle spasms.     doxazosin 2 MG tablet  Commonly known as:  CARDURA  Take 2 mg by mouth at bedtime.  febuxostat 40 MG tablet  Commonly known as:  ULORIC  Take 40 mg by mouth daily.     multivitamin tablet  Take 1 tablet by mouth daily. Renal supplement     oxyCODONE 5 MG immediate release tablet  Commonly known as:  Oxy IR/ROXICODONE  Take 1-2 tablets (5-10 mg total) by mouth every 4 (four) hours as needed for severe pain or breakthrough pain.     oxyCODONE-acetaminophen 5-325 MG per tablet  Commonly known as:  ROXICET  Take 1-2 tablets by mouth every 4 (four) hours as needed for severe pain.     RENO CAPS 1 MG Caps     sodium bicarbonate 650 MG tablet  Take 650 mg by mouth 4 (four) times daily.      ASK your doctor about these medications       famotidine 20 MG tablet  Commonly known as:  PEPCID  Take 1 tablet (20 mg total) by mouth 2 (two) times daily as needed for heartburn (upset stomach).         SignedClydene Fake, MD 03/16/2013, 8:45 AM

## 2013-03-20 ENCOUNTER — Encounter (HOSPITAL_COMMUNITY): Payer: Self-pay | Admitting: Neurosurgery

## 2013-09-26 ENCOUNTER — Other Ambulatory Visit: Payer: Self-pay | Admitting: Endocrinology

## 2013-09-26 DIAGNOSIS — E041 Nontoxic single thyroid nodule: Secondary | ICD-10-CM

## 2013-09-28 ENCOUNTER — Ambulatory Visit (HOSPITAL_COMMUNITY)
Admission: RE | Admit: 2013-09-28 | Discharge: 2013-09-28 | Disposition: A | Discharge status: Home / Self Care | Payer: 59 | Source: Ambulatory Visit | Attending: Endocrinology | Admitting: Endocrinology

## 2013-09-28 DIAGNOSIS — E041 Nontoxic single thyroid nodule: Secondary | ICD-10-CM

## 2013-09-28 NOTE — Procedures (Signed)
Successful US guided left thyroid nodule FNA x 4. No immediate complications, sterile dressing and ice pack applied.   Pattricia BossKoreen Shalonda Sachse PA-C Interventional Radiology  09/28/13 2:50 PM

## 2013-10-05 ENCOUNTER — Encounter (HOSPITAL_COMMUNITY): Payer: Self-pay | Admitting: Pharmacy Technician

## 2013-10-05 ENCOUNTER — Encounter (HOSPITAL_COMMUNITY): Payer: Self-pay | Admitting: *Deleted

## 2013-10-05 NOTE — H&P (Signed)
Assessment  Chronic kidney disease (CKD), stage IV (severe) (585.4) (N18.4); solitary left kidney.  congenital left upj obsturction age 38.Required dilatation and stenit.  Dr Leretha Dykes. Cervical disc disease (722.91) (M50.90); c5-c6 BROAD based disc bulge with possible impingement on left C6 nerve root, mri 02/05/13.  disc bulge w/ effacement of cord but no myelopathic cord changes on C6-C7. 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 Discussed  Follicular lesion left thyroid lobe. Recommend surgical excision with left lobectomy, frozen section and possible total thyroidectomy. After he has recovered from this, he may proceed with renal transplant. We discussed the risks of anesthesia including complete renal failure. We had a lengthy discussion about the surgery, the incision, the risks of nerve injury and hypocalcemia. All questions were answered. We will schedule as soon as possible. Reason For Visit  Mitchell Bryant is here today at the kind request of Balan, Bindubal for consultation and opinion for thyroid. HPI  History of chronic renal failure, ready to get on the transplant list except for a recently identified left thyroid nodule with suspicious cytology. At [facility], they are not willing to put him on a transplant list until noted the exact diagnosis of a thyroid mass. Allergies  NSAIDs; Renal Insufficiency. Current Meds  Sodium Bicarbonate 650 MG Oral Tablet;TAKE 2 TABLET DAILY; RPT ClonazePAM 0.5 MG Oral Tablet;; RPT Doxazosin Mesylate 2 MG Oral Tablet;TAKE 1 TABLET DAILY.; RPT Sensipar 60 MG Oral Tablet;; RPT AmLODIPine Besylate 10 MG Oral Tablet;TAKE 1 TABLET BY MOUTH ONCE DAILY; RPT Renvela TABS;; RPT Uloric 40 MG Oral Tablet;TAKE 1 TABLET DAILY; RPT. Active Problems  Abnormal MRI, neck   (793.99) (R93.8) Allergic rhinitis due to pollen   (477.0) (J30.1) Anemia in chronic kidney disease   (285.21,585.9) (N18.9,D63.1); Dr Leretha Dykes Bronchitis, acute   (466.0) (J20.9) Cervical disc disease   (722.91)  (M50.90); c5-c6 BROAD based disc bulge with possible impingement on left C6 nerve root, mri 02/05/13.  disc bulge w/ effacement of cord but no myelopathic cord changes on C6-C7 Chronic kidney disease (CKD), stage IV (severe)   (585.4) (N18.4); solitary left kidney.  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  Dr Leretha Dykes Cystoscopy With Insertion Of Ureteral Stent Left   08 Jul 2002 Decreased hearing   (389.9) (H91.90) Disorder of kidney and ureter   (593.9) (N28.9); and dr Leretha Dykes Dysfunction of eustachian tube   (381.81) (H69.80) History of History of gastrointestinal hemorrhage   07 May 2002 (V12.79) (Z87.19); nonsteroid-induced duod ulcer Hypertension   (401.9) (I10) Metabolic alkalosis   (276.3) (Z61.0); fr nephrology records:  dr Janit Pagan due to kidney dz Muscle weakness   (728.87) (M62.81); triceps left Neck pain   (723.1) (M54.2); consider C spine disc  C 6-7 Numbness and tingling in left hand   (782.0) (R20.2) Otitis, serous   (381.4) (H65.90) Restless leg syndrome   (333.94) (G25.81). PMH  Acute serous otitis media (381.01) (H65.00); Resolved: 22Dec2014; left Acute upper respiratory infection (465.9) (J06.9); Resolved: 22Dec2014 Blood in stool (578.1) (K92.1) Gastroenteritis, noninfectious Resolving (558.9) (K52.9); Resolved: 22Dec2014 History of gastritis (V12.79) (Z87.19); Resolved: 22Dec2014; resolving History of gastrointestinal hemorrhage 07 May 2002 (V12.79) (Z87.19); nonsteroid-induced duod ulcer Hydronephrosis of left kidney 08 Mar 2002 (591) (N13.30); stent placement. PSH  Appendectomy 19 Aug 2007; assisted by [facility] Biopsy Renal 09 Mar 1983; interstitial fibrosis, turbular atrophy and maybe sclerosis Complete Colonoscopy 29 Mar 2008 Cystoscopy With Insertion Of Ureteral Stent Left 08 Jul 2002 Laparoscopic GI Procedure In Detail Stomach Neck Surgery. Family Hx  Family  history of hypertension: Father (V17.49) (Z82.49). Personal Hx   Denied Alcohol Use (History) Denied Current Smoker Denied Drug Use Never a smoker. ROS  12 system ROS was obtained and reviewed on the Health Maintenance form dated today.  Positive responses are shown above.  If the symptom is not checked, the patient has denied it. Vital Signs   Recorded by Skolimowski,Sharon on 05 Oct 2013 02:06 PM BP:130/72,  Height: 5 ft 11 in, Weight: 183 lb , BMI: 25.5 kg/m2,  BMI Calculated: 25.52 ,  BSA Calculated: 2.03. Physical Exam  APPEARANCE: Well developed, well nourished, in no acute distress.  Normal affect, in a pleasant mood.  Oriented to time, place and person. COMMUNICATION: Normal voice   HEAD & FACE:  No scars, lesions or masses of head and face.  Sinuses nontender to palpation.  Salivary glands without mass or tenderness.  Facial strength symmetric.  No facial lesion, scars, or mass. EYES: EOMI with normal primary gaze alignment. Visual acuity grossly intact.  PERRLA EXTERNAL EAR & NOSE: No scars, lesions or masses  EAC & TYMPANIC MEMBRANE:  EAC shows no obstructing lesions or debris and tympanic membranes are normal bilaterally with good movement to insufflation. GROSS HEARING: Normal  TMJ:  Nontender  INTRANASAL EXAM: No polyps or purulence.  NASOPHARYNX: Normal, without lesions. LIPS, TEETH & GUMS: No lip lesions, normal dentition and normal gums. ORAL CAVITY/OROPHARYNX:  Oral mucosa moist without lesion or asymmetry of the palate, tongue, tonsil or posterior pharynx. LARYNX (mirror exam):  No lesions of the epiglottis, false cord or TVC's and cords move well to phonation. HYPOPHARYNX (mirror exam): No lesions, asymmetry or pooling of secretions. NECK:  Supple without adenopathy or mass. THYROID: 3 cm mass palpable on the left.  NEUROLOGIC:  No gross CN deficits. No nystagmus noted.   LYMPHATIC:  No enlarged nodes palpable. Signature  Electronically signed by : Serena ColonelJefry  Felise Georgia  M.D.; 10/05/2013 6:52 PM EST.

## 2013-10-07 MED ORDER — CEFAZOLIN SODIUM-DEXTROSE 2-3 GM-% IV SOLR: 2.0000 g | INTRAVENOUS | Status: DC

## 2013-10-08 ENCOUNTER — Observation Stay (HOSPITAL_COMMUNITY)
Admission: RE | Admit: 2013-10-08 | Discharge: 2013-10-10 | Disposition: A | Discharge status: Home / Self Care | Payer: 59 | Source: Ambulatory Visit | Attending: Otolaryngology | Admitting: Otolaryngology

## 2013-10-08 ENCOUNTER — Encounter (HOSPITAL_COMMUNITY): Payer: Self-pay | Admitting: *Deleted

## 2013-10-08 ENCOUNTER — Ambulatory Visit (HOSPITAL_COMMUNITY): Payer: 59 | Admitting: Certified Registered Nurse Anesthetist

## 2013-10-08 ENCOUNTER — Encounter (HOSPITAL_COMMUNITY)
Admission: RE | Disposition: A | Discharge status: Home / Self Care | Payer: Self-pay | Source: Ambulatory Visit | Attending: Otolaryngology

## 2013-10-08 ENCOUNTER — Ambulatory Visit (HOSPITAL_COMMUNITY): Payer: 59

## 2013-10-08 ENCOUNTER — Encounter (HOSPITAL_COMMUNITY): Payer: 59 | Admitting: Certified Registered Nurse Anesthetist

## 2013-10-08 DIAGNOSIS — K279 Peptic ulcer, site unspecified, unspecified as acute or chronic, without hemorrhage or perforation: Secondary | ICD-10-CM | POA: Insufficient documentation

## 2013-10-08 DIAGNOSIS — E210 Primary hyperparathyroidism: Secondary | ICD-10-CM | POA: Diagnosis not present

## 2013-10-08 DIAGNOSIS — Z79899 Other long term (current) drug therapy: Secondary | ICD-10-CM | POA: Diagnosis not present

## 2013-10-08 DIAGNOSIS — D631 Anemia in chronic kidney disease: Secondary | ICD-10-CM | POA: Diagnosis not present

## 2013-10-08 DIAGNOSIS — I129 Hypertensive chronic kidney disease with stage 1 through stage 4 chronic kidney disease, or unspecified chronic kidney disease: Secondary | ICD-10-CM | POA: Insufficient documentation

## 2013-10-08 DIAGNOSIS — N184 Chronic kidney disease, stage 4 (severe): Secondary | ICD-10-CM | POA: Diagnosis not present

## 2013-10-08 DIAGNOSIS — E041 Nontoxic single thyroid nodule: Secondary | ICD-10-CM | POA: Diagnosis not present

## 2013-10-08 DIAGNOSIS — Y838 Other surgical procedures as the cause of abnormal reaction of the patient, or of later complication, without mention of misadventure at the time of the procedure: Secondary | ICD-10-CM | POA: Diagnosis not present

## 2013-10-08 DIAGNOSIS — Z9889 Other specified postprocedural states: Secondary | ICD-10-CM

## 2013-10-08 DIAGNOSIS — R339 Retention of urine, unspecified: Secondary | ICD-10-CM | POA: Diagnosis not present

## 2013-10-08 DIAGNOSIS — IMO0002 Reserved for concepts with insufficient information to code with codable children: Secondary | ICD-10-CM | POA: Diagnosis not present

## 2013-10-08 DIAGNOSIS — N039 Chronic nephritic syndrome with unspecified morphologic changes: Secondary | ICD-10-CM

## 2013-10-08 DIAGNOSIS — E079 Disorder of thyroid, unspecified: Secondary | ICD-10-CM | POA: Diagnosis present

## 2013-10-08 DIAGNOSIS — N9989 Other postprocedural complications and disorders of genitourinary system: Secondary | ICD-10-CM | POA: Insufficient documentation

## 2013-10-08 DIAGNOSIS — E890 Postprocedural hypothyroidism: Secondary | ICD-10-CM

## 2013-10-08 HISTORY — PX: THYROIDECTOMY: SHX17

## 2013-10-08 HISTORY — DX: Disorder of thyroid, unspecified: E07.9

## 2013-10-08 LAB — CBC
HEMATOCRIT: 29 % — AB (ref 39.0–52.0)
Hemoglobin: 9.9 g/dL — ABNORMAL LOW (ref 13.0–17.0)
MCH: 30.1 pg (ref 26.0–34.0)
MCHC: 34.1 g/dL (ref 30.0–36.0)
MCV: 88.1 fL (ref 78.0–100.0)
Platelets: 260 10*3/uL (ref 150–400)
RBC: 3.29 MIL/uL — ABNORMAL LOW (ref 4.22–5.81)
RDW: 13.1 % (ref 11.5–15.5)
WBC: 6.8 10*3/uL (ref 4.0–10.5)

## 2013-10-08 LAB — BASIC METABOLIC PANEL
ANION GAP: 17 — AB (ref 5–15)
BUN: 83 mg/dL — AB (ref 6–23)
CHLORIDE: 108 meq/L (ref 96–112)
CO2: 17 mEq/L — ABNORMAL LOW (ref 19–32)
Calcium: 10.5 mg/dL (ref 8.4–10.5)
Creatinine, Ser: 8.22 mg/dL — ABNORMAL HIGH (ref 0.50–1.35)
GFR, EST AFRICAN AMERICAN: 9 mL/min — AB (ref >90)
GFR, EST NON AFRICAN AMERICAN: 7 mL/min — AB (ref >90)
Glucose, Bld: 96 mg/dL (ref 70–99)
POTASSIUM: 5 meq/L (ref 3.7–5.3)
SODIUM: 142 meq/L (ref 137–147)

## 2013-10-08 SURGERY — THYROIDECTOMY
Anesthesia: General | Site: Neck | Laterality: Left

## 2013-10-08 MED ORDER — PROMETHAZINE HCL 25 MG RE SUPP: 25.0000 mg | Freq: Four times a day (QID) | RECTAL | Status: DC | PRN

## 2013-10-08 MED ORDER — PHENOL 1.4 % MT LIQD: 1.0000 | OROMUCOSAL | Status: DC | PRN

## 2013-10-08 MED ORDER — MIDAZOLAM HCL 5 MG/5ML IJ SOLN
INTRAMUSCULAR | Status: DC | PRN
  Administered 2013-10-08: 2 mg via INTRAVENOUS

## 2013-10-08 MED ORDER — HYDROMORPHONE HCL PF 1 MG/ML IJ SOLN
INTRAMUSCULAR | Status: AC
  Filled 2013-10-08: qty 1

## 2013-10-08 MED ORDER — CINACALCET HCL 30 MG PO TABS
60.0000 mg | ORAL_TABLET | Freq: Two times a day (BID) | ORAL | Status: DC
  Administered 2013-10-08 – 2013-10-10 (×4): 60 mg via ORAL
  Filled 2013-10-08 (×6): qty 2

## 2013-10-08 MED ORDER — RENA-VITE PO TABS
1.0000 | ORAL_TABLET | Freq: Every day | ORAL | Status: DC
  Administered 2013-10-08 – 2013-10-09 (×2): 1 via ORAL
  Filled 2013-10-08 (×3): qty 1

## 2013-10-08 MED ORDER — HYDROCODONE-ACETAMINOPHEN 7.5-325 MG PO TABS: 1.0000 | ORAL_TABLET | Freq: Four times a day (QID) | ORAL | Status: AC | PRN

## 2013-10-08 MED ORDER — ONDANSETRON HCL 4 MG/2ML IJ SOLN
INTRAMUSCULAR | Status: AC
  Filled 2013-10-08: qty 2

## 2013-10-08 MED ORDER — PROPOFOL 10 MG/ML IV BOLUS
INTRAVENOUS | Status: AC
  Filled 2013-10-08: qty 20

## 2013-10-08 MED ORDER — ONDANSETRON HCL 4 MG/2ML IJ SOLN
INTRAMUSCULAR | Status: DC | PRN
  Administered 2013-10-08: 4 mg via INTRAVENOUS

## 2013-10-08 MED ORDER — CLONAZEPAM 0.5 MG PO TABS
0.5000 mg | ORAL_TABLET | Freq: Every day | ORAL | Status: DC
  Administered 2013-10-08 – 2013-10-09 (×2): 0.5 mg via ORAL
  Filled 2013-10-08 (×2): qty 1

## 2013-10-08 MED ORDER — FENTANYL CITRATE 0.05 MG/ML IJ SOLN
INTRAMUSCULAR | Status: AC
  Filled 2013-10-08: qty 5

## 2013-10-08 MED ORDER — FEBUXOSTAT 40 MG PO TABS
40.0000 mg | ORAL_TABLET | Freq: Every day | ORAL | Status: DC
  Administered 2013-10-09 – 2013-10-10 (×2): 40 mg via ORAL
  Filled 2013-10-08 (×2): qty 1

## 2013-10-08 MED ORDER — GLYCOPYRROLATE 0.2 MG/ML IJ SOLN
INTRAMUSCULAR | Status: DC | PRN
  Administered 2013-10-08: .6 mg via INTRAVENOUS

## 2013-10-08 MED ORDER — DOXAZOSIN MESYLATE 4 MG PO TABS
4.0000 mg | ORAL_TABLET | Freq: Every day | ORAL | Status: DC
  Administered 2013-10-08 – 2013-10-09 (×2): 4 mg via ORAL
  Filled 2013-10-08 (×3): qty 1

## 2013-10-08 MED ORDER — SODIUM CHLORIDE 0.9 % IV SOLN
INTRAVENOUS | Status: DC
  Administered 2013-10-08 (×3): via INTRAVENOUS

## 2013-10-08 MED ORDER — AMLODIPINE BESYLATE 10 MG PO TABS
10.0000 mg | ORAL_TABLET | Freq: Every day | ORAL | Status: DC
  Administered 2013-10-09 – 2013-10-10 (×2): 10 mg via ORAL
  Filled 2013-10-08 (×2): qty 1

## 2013-10-08 MED ORDER — ROCURONIUM BROMIDE 50 MG/5ML IV SOLN
INTRAVENOUS | Status: AC
  Filled 2013-10-08: qty 1

## 2013-10-08 MED ORDER — OXYCODONE HCL 5 MG/5ML PO SOLN: 5.0000 mg | Freq: Once | ORAL | Status: AC | PRN

## 2013-10-08 MED ORDER — NEOSTIGMINE METHYLSULFATE 10 MG/10ML IV SOLN
INTRAVENOUS | Status: DC | PRN
  Administered 2013-10-08: 4 mg via INTRAVENOUS

## 2013-10-08 MED ORDER — PROPOFOL 10 MG/ML IV BOLUS
INTRAVENOUS | Status: DC | PRN
  Administered 2013-10-08: 150 mg via INTRAVENOUS

## 2013-10-08 MED ORDER — SEVELAMER CARBONATE 800 MG PO TABS
800.0000 mg | ORAL_TABLET | Freq: Three times a day (TID) | ORAL | Status: DC
  Administered 2013-10-08 – 2013-10-10 (×5): 800 mg via ORAL
  Filled 2013-10-08 (×8): qty 1

## 2013-10-08 MED ORDER — PHENYLEPHRINE HCL 10 MG/ML IJ SOLN
10.0000 mg | INTRAMUSCULAR | Status: DC | PRN
  Administered 2013-10-08: 10 ug/min via INTRAVENOUS

## 2013-10-08 MED ORDER — LIDOCAINE HCL (CARDIAC) 20 MG/ML IV SOLN
INTRAVENOUS | Status: DC | PRN
  Administered 2013-10-08: 40 mg via INTRAVENOUS

## 2013-10-08 MED ORDER — FENTANYL CITRATE 0.05 MG/ML IJ SOLN
INTRAMUSCULAR | Status: DC | PRN
  Administered 2013-10-08 (×4): 50 ug via INTRAVENOUS
  Administered 2013-10-08: 100 ug via INTRAVENOUS

## 2013-10-08 MED ORDER — 0.9 % SODIUM CHLORIDE (POUR BTL) OPTIME
TOPICAL | Status: DC | PRN
  Administered 2013-10-08: 1000 mL

## 2013-10-08 MED ORDER — OXYCODONE HCL 5 MG PO TABS
5.0000 mg | ORAL_TABLET | Freq: Once | ORAL | Status: AC | PRN
  Administered 2013-10-08: 5 mg via ORAL

## 2013-10-08 MED ORDER — SODIUM BICARBONATE 650 MG PO TABS
1300.0000 mg | ORAL_TABLET | Freq: Two times a day (BID) | ORAL | Status: DC
  Administered 2013-10-08 – 2013-10-10 (×5): 1300 mg via ORAL
  Filled 2013-10-08 (×6): qty 2

## 2013-10-08 MED ORDER — LIDOCAINE HCL (CARDIAC) 20 MG/ML IV SOLN
INTRAVENOUS | Status: AC
  Filled 2013-10-08: qty 5

## 2013-10-08 MED ORDER — HYDROCODONE-ACETAMINOPHEN 5-325 MG PO TABS
1.0000 | ORAL_TABLET | ORAL | Status: DC | PRN
  Administered 2013-10-08 – 2013-10-10 (×4): 2 via ORAL
  Filled 2013-10-08 (×5): qty 2

## 2013-10-08 MED ORDER — OXYCODONE HCL 5 MG PO TABS
ORAL_TABLET | ORAL | Status: AC
  Filled 2013-10-08: qty 1

## 2013-10-08 MED ORDER — DEXTROSE-NACL 5-0.9 % IV SOLN
INTRAVENOUS | Status: DC
  Administered 2013-10-08 – 2013-10-09 (×2): via INTRAVENOUS

## 2013-10-08 MED ORDER — MIDAZOLAM HCL 2 MG/2ML IJ SOLN
INTRAMUSCULAR | Status: AC
  Filled 2013-10-08: qty 2

## 2013-10-08 MED ORDER — CEFAZOLIN SODIUM-DEXTROSE 2-3 GM-% IV SOLR
INTRAVENOUS | Status: AC
  Administered 2013-10-08: 2 g via INTRAVENOUS
  Filled 2013-10-08: qty 50

## 2013-10-08 MED ORDER — PROMETHAZINE HCL 25 MG PO TABS: 25.0000 mg | ORAL_TABLET | Freq: Four times a day (QID) | ORAL | Status: DC | PRN

## 2013-10-08 MED ORDER — LIDOCAINE HCL 4 % MT SOLN
OROMUCOSAL | Status: DC | PRN
  Administered 2013-10-08: 1 mL via TOPICAL

## 2013-10-08 MED ORDER — PROMETHAZINE HCL 25 MG RE SUPP: 25.0000 mg | Freq: Four times a day (QID) | RECTAL | Status: AC | PRN

## 2013-10-08 MED ORDER — HYDROMORPHONE HCL PF 1 MG/ML IJ SOLN
0.2500 mg | INTRAMUSCULAR | Status: DC | PRN
  Administered 2013-10-08 (×4): 0.5 mg via INTRAVENOUS

## 2013-10-08 MED ORDER — ROCURONIUM BROMIDE 100 MG/10ML IV SOLN
INTRAVENOUS | Status: DC | PRN
  Administered 2013-10-08: 50 mg via INTRAVENOUS
  Administered 2013-10-08: 10 mg via INTRAVENOUS

## 2013-10-08 SURGICAL SUPPLY — 34 items
CANISTER SUCTION 2500CC (MISCELLANEOUS) ×3 IMPLANT
CLEANER TIP ELECTROSURG 2X2 (MISCELLANEOUS) ×3 IMPLANT
CONT SPEC 4OZ CLIKSEAL STRL BL (MISCELLANEOUS) ×3 IMPLANT
CORDS BIPOLAR (ELECTRODE) ×3 IMPLANT
COVER SURGICAL LIGHT HANDLE (MISCELLANEOUS) ×3 IMPLANT
DERMABOND ADVANCED (GAUZE/BANDAGES/DRESSINGS) ×2
DERMABOND ADVANCED .7 DNX12 (GAUZE/BANDAGES/DRESSINGS) ×1 IMPLANT
DRAIN HEMOVAC 7FR (DRAIN) IMPLANT
DRAIN SNY 10 ROU (WOUND CARE) IMPLANT
ELECT COATED BLADE 2.86 ST (ELECTRODE) ×3 IMPLANT
ELECT REM PT RETURN 9FT ADLT (ELECTROSURGICAL) ×3
ELECTRODE REM PT RTRN 9FT ADLT (ELECTROSURGICAL) ×1 IMPLANT
EVACUATOR SILICONE 100CC (DRAIN) ×3 IMPLANT
FORCEPS BIPOLAR SPETZLER 8 1.0 (NEUROSURGERY SUPPLIES) ×3 IMPLANT
GLOVE BIO SURGEON STRL SZ 6.5 (GLOVE) ×2 IMPLANT
GLOVE BIO SURGEONS STRL SZ 6.5 (GLOVE) ×1
GLOVE BIOGEL PI IND STRL 6.5 (GLOVE) ×1 IMPLANT
GLOVE BIOGEL PI INDICATOR 6.5 (GLOVE) ×2
GLOVE ECLIPSE 6.5 STRL STRAW (GLOVE) ×3 IMPLANT
GLOVE ECLIPSE 7.5 STRL STRAW (GLOVE) ×3 IMPLANT
GOWN STRL REUS W/ TWL LRG LVL3 (GOWN DISPOSABLE) ×4 IMPLANT
GOWN STRL REUS W/TWL LRG LVL3 (GOWN DISPOSABLE) ×8
KIT BASIN OR (CUSTOM PROCEDURE TRAY) ×3 IMPLANT
KIT ROOM TURNOVER OR (KITS) ×3 IMPLANT
NS IRRIG 1000ML POUR BTL (IV SOLUTION) ×3 IMPLANT
PAD ARMBOARD 7.5X6 YLW CONV (MISCELLANEOUS) ×6 IMPLANT
PENCIL FOOT CONTROL (ELECTRODE) ×3 IMPLANT
STAPLER VISISTAT 35W (STAPLE) ×3 IMPLANT
SUT CHROMIC 4 0 PS 2 18 (SUTURE) ×6 IMPLANT
SUT ETHILON 3 0 PS 1 (SUTURE) ×3 IMPLANT
SUT SILK 4 0 P 3 (SUTURE) IMPLANT
SUT SILK 4 0 REEL (SUTURE) ×3 IMPLANT
TOWEL OR 17X24 6PK STRL BLUE (TOWEL DISPOSABLE) ×6 IMPLANT
TRAY ENT MC OR (CUSTOM PROCEDURE TRAY) ×3 IMPLANT

## 2013-10-08 NOTE — Op Note (Signed)
OPERATIVE REPORT  DATE OF SURGERY: 10/08/2013  PATIENT:  Mitchell Bryant,  38 y.o. 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 A shoulder roll was placed beneath the shoulder blades and the neck was extended. The neck was prepped and draped in a standard fashion. A low collar transverse incision was outlined marking pen and was incised with electrocautery. Dissection was continued down through the platysma layer. Subplatysmal flaps were elevated superiorly to the thyroid cartilage and inferiorly to the clavicle. Self-retaining thyroid retractor was used throughout the case.  The midline fascia was divided. The strap muscles were reflected off the left thyroid lobe laterally. There is a large firm mass replacing the majority of the left lobe. The lobe was reflected inferomedially while the upper vasculature was identified. The superior vessels were identified separately, ligated between clamps and divided. 4-0 silk ties were used. The middle thyroid vein was ligated and divided. As the gland was brought forward the recurrent nerve was identified and preserved. The gland was somewhat adhesed to the surrounding tissues including the strap muscles. It appeared that there were some clinical thyroiditis. What appeared to be a very large inferior parathyroid was identified and preserved. A separate small nodule that appeared consistent with additional thyroid tissue was taken with specimen. The gland was dissected off  of the trachea using electrocautery. The isthmus was divided with electrocautery. The left lobe was sent for pathologic evaluation. This was consistent with follicular neoplasm. At this point a decision was made to not dissecting the opposite side, given the sticky nature of the dissection. The wound was irrigated and suctioned. Hemostasis was completed. A drain was placed through separate midline stab incision and secured in place with nylon suture. The midline fascia was reapproximated with interrupted chromic as was the platysma layer. A running subcuticular chromic suture closure was performed and Dermabond was used on the skin. Patient was awakened extubated and transferred to recovery in stable condition.   PATIENT DISPOSITION:  To PACU, stable

## 2013-10-08 NOTE — Transfer of Care (Signed)
Immediate Anesthesia Transfer of Care Note  Patient: Mitchell Bryant  Procedure(s) Performed: Procedure(s): LEFT THYROIDECTOMY WITH FROZEN SECTIOn (Left)  Patient Location: PACU  Anesthesia Type:General  Level of Consciousness: awake, alert  and oriented  Airway & Oxygen Therapy: Patient Spontanous Breathing  Post-op Assessment: Report given to PACU RN  Post vital signs: Reviewed and stable  Complications: No apparent anesthesia complications

## 2013-10-08 NOTE — Discharge Instructions (Signed)
You may use soap and water on the incision but do not use any creams oils or ointment.

## 2013-10-08 NOTE — Consult Note (Signed)
Mitchell Bryant is an 38 y.o. male referred by Mitchell Bryant   Chief Complaint: CKD 5, Sec HPTH HPI: [name] with CKD 5 admitted for Lt thyroidectomy for newly found thyroid nodule.  He is on the tx list at [facility] and also has a potential donor but needed to have this surgery before any transplantation.  He is followed by Mitchell Bryant of [facility].  Past Medical History  Diagnosis Date  . Kidney failure     Pt has one kidney functioning at 12%.  Is on Transplant list.  . Hypertension   . Hyperparathyroidism   . Hyperuricemia   . Insomnia     due to pain  . GERD (gastroesophageal reflux disease)   . Constipation   . History of blood transfusion     due to GI Bleed  . Gastric ulcer 2004    hx of - bleed  . Thyroid condition     Past Surgical History  Procedure Laterality Date  . Appendectomy    . Kidney surgery      biospy  . Anterior cervical decomp/discectomy fusion N/A 03/15/2013    Procedure: ANTERIOR CERVICAL DECOMPRESSION/DISCECTOMY FUSION 2 LEVELS;  Surgeon: Mitchell Fake, MD;  Location: [facility];  Service: Neurosurgery;  Laterality: N/A;  C5-6 C6-7 Anterior cervical decompression/diskectomy/fusion  . Colonoscopy    No FH renal disease  History reviewed. No pertinent family history. Social History:  reports that he has never smoked. He has never used smokeless tobacco. He reports that he does not drink alcohol or use illicit drugs.  Married and lives with wife  Allergies:  Allergies  Allergen Reactions  . Nsaids Other (See Comments)    Renal insuffciency    Medications Prior to Admission  Medication Sig Dispense Refill  . amLODipine (NORVASC) 10 MG tablet Take 10 mg by mouth daily.      . cinacalcet (SENSIPAR) 60 MG tablet Take 60 mg by mouth 2 (two) times daily.       . clonazePAM (KLONOPIN) 0.5 MG tablet Take 0.5 mg by mouth at bedtime.       Marland Kitchen doxazosin (CARDURA) 4 MG tablet Take 4 mg by mouth at bedtime.      . febuxostat (ULORIC) 40 MG tablet Take 40 mg by  mouth daily.      . multivitamin (RENA-VIT) TABS tablet Take 1 tablet by mouth daily.      . sodium bicarbonate 650 MG tablet Take 1,300 mg by mouth 2 (two) times daily.          Lab Results: UA: ND   Recent Labs  10/08/13 0912  WBC 6.8  HGB 9.9*  HCT 29.0*  PLT 260   BMET  Recent Labs  10/08/13 0912  NA 142  K 5.0  CL 108  CO2 17*  GLUCOSE 96  BUN 83*  CREATININE 8.22*  CALCIUM 10.5   LFT No results found for this basename: PROT, ALBUMIN, AST, ALT, ALKPHOS, BILITOT, BILIDIR, IBILI,  in the last 72 hours Dg Chest 2 View  10/08/2013   CLINICAL DATA:  Preoperative left thyroidectomy  EXAM: CHEST  2 VIEW  COMPARISON:  04/17/2013  FINDINGS: Cardiac shadow is within normal limits. The lungs are clear bilaterally. Postsurgical changes are noted in the cervical spine no acute bony abnormality is noted.  IMPRESSION: No active cardiopulmonary disease.   Electronically Signed   By: Mitchell Bryant M.D.   On: 10/08/2013 09:25    ROS: No change in vision  No uremic Sx No SOB  + pain in neck from surgery No CP No ABd pain No dysuria  PHYSICAL EXAM: Blood pressure 141/64, pulse 95, temperature 97.5 F (36.4 C), temperature source Oral, resp. rate 14, height 5\' 11"  (1.803 m), weight 83.008 kg (183 lb), SpO2 100.00%. HEENT: PERRLA EOMI NECK:Incision in lower neck with midline drain LUNGS:clear CARDIAC:RRR with 3/6 systolic M ABD:+ BS NTND No HSM EXT:No edema NEURO:CNI Ox3 no asterixis  Assessment: 1. SP Lt thyroidectomy for thyroid nodule 2. CKD 5 3. 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 Anemia 5. HTN 6. Metabolic acidosis PLAN: 1. Meds reviewed.  Will resume renvela 1 AC 2. Cont bicarb pills 3. Recheck Scr in AM 4.  Renal diet   Mitchell Bryant T 10/08/2013, 4:01 PM

## 2013-10-08 NOTE — Progress Notes (Signed)
Patient ID: Graylon GoodJason Obryant, male   DOB: 11/20/1975, 38 y.o.   MRN: [id number] No complaints. AF VSS  Drain 26 cc Alert.  NAD. Thyroid incision clean and intact, no fluid collection.  Drain functioning.  Normal voice. A/P: Thyroid nodule s/p thyroid lobectomy Doing well.  Observe overnight with drain in place.

## 2013-10-08 NOTE — Anesthesia Preprocedure Evaluation (Addendum)
Anesthesia Evaluation  Patient identified by MRN, date of birth, ID band Patient awake    Reviewed: Allergy & Precautions, H&P , NPO status , Patient's Chart, lab work & pertinent test results  Airway Mallampati: II TM Distance: >3 FB Neck ROM: Limited    Dental  (+) Teeth Intact, Dental Advisory Given   Pulmonary          Cardiovascular hypertension, Pt. on medications Rhythm:Regular Rate:Normal     Neuro/Psych    GI/Hepatic PUD,   Endo/Other    Renal/GU Renal InsufficiencyRenal disease     Musculoskeletal   Abdominal (+)  Abdomen: soft.    Peds  Hematology   Anesthesia Other Findings   Reproductive/Obstetrics                        Anesthesia Physical Anesthesia Plan  ASA: III  Anesthesia Plan: General   Post-op Pain Management:    Induction: Intravenous  Airway Management Planned: Oral ETT  Additional Equipment:   Intra-op Plan:   Post-operative Plan: Extubation in OR  Informed Consent: I have reviewed the patients History and Physical, chart, labs and discussed the procedure including the risks, benefits and alternatives for the proposed anesthesia with the patient or authorized representative who has indicated his/her understanding and acceptance.   Dental advisory given  Plan Discussed with: Anesthesiologist and Surgeon  Anesthesia Plan Comments:         Anesthesia Quick Evaluation

## 2013-10-08 NOTE — Anesthesia Postprocedure Evaluation (Signed)
  Anesthesia Post-op Note  Patient: Mitchell Bryant  Procedure(s) Performed: Procedure(s): LEFT THYROIDECTOMY WITH FROZEN SECTIOn (Left)  Patient Location: PACU  Anesthesia Type:General  Level of Consciousness: awake and alert   Airway and Oxygen Therapy: Patient Spontanous Breathing  Post-op Pain: moderate  Post-op Assessment: Post-op Vital signs reviewed, Patient's Cardiovascular Status Stable and Respiratory Function Stable  Post-op Vital Signs: Reviewed  Filed Vitals:   10/08/13 1245  BP: 147/83  Pulse: 107  Temp:   Resp: 14    Complications: No apparent anesthesia complications

## 2013-10-08 NOTE — Interval H&P Note (Signed)
History and Physical Interval Note:  10/08/2013 9:43 AM  Mitchell Bryant  has presented today for surgery, with the diagnosis of Left thyroid nodule  The various methods of treatment have been discussed with the patient and family. After consideration of risks, benefits and other options for treatment, the patient has consented to  Procedure(s): LEFT THYROIDECTOMY WITH FROZEN SECTION/POSSIBLE TOTAL THYROIDECTOMY (Left) as a surgical intervention .  The patient's history has been reviewed, patient examined, no change in status, stable for surgery.  I have reviewed the patient's chart and labs.  Questions were answered to the patient's satisfaction.     [name]

## 2013-10-09 DIAGNOSIS — E041 Nontoxic single thyroid nodule: Secondary | ICD-10-CM | POA: Diagnosis not present

## 2013-10-09 LAB — MRSA PCR SCREENING: MRSA by PCR: NEGATIVE

## 2013-10-09 NOTE — Progress Notes (Signed)
Removed foley catheter from patient around 11:50am. Patient has voided some in very small amounts since foley was removed. Discussed with MD Pollyann Kennedyosen. Stated that patient will stay another night and possibly have an in and out cath or foley catheter placed if not able to void. Patient voided about 100ml around 1800 and stated that he will continue to go to the bathroom on his own. He refused to get the in and out placed for now until he tries to urinate more. MD notified and made aware.

## 2013-10-09 NOTE — Progress Notes (Signed)
Called Dr. Jenne PaneBates about Mr. Bible not been able to void. After bladder scanning pt bladder scan showed >999. So and order was given to I&O cath pt. Pt got relief from been I&O cath.

## 2013-10-09 NOTE — Progress Notes (Signed)
Pt was still unable to void on own this morning. Spoke with Dr. Jenne PaneBates about the urine retention the pt is having. Bladder scan was done and showed >999. MD gave order to place foley cath.

## 2013-10-09 NOTE — Progress Notes (Signed)
S: Pain controlled.  Unable to void this am so foley placed.  Drinking fluids O:BP 124/76  Pulse 88  Temp(Src) 97.5 F (36.4 C) (Oral)  Resp 16  Ht 5\' 11"  (1.803 m)  Wt 83.008 kg (183 lb)  BMI 25.53 kg/m2  SpO2 100%  Intake/Output Summary (Last 24 hours) at 10/09/13 1135 Last data filed at 10/09/13 0700  Gross per 24 hour  Intake 2143.33 ml  Output   1936 ml  Net 207.33 ml   Weight change:  UJW:JXBJYGen:awake and alert CVS:RRR Resp:clear Abd:+ BS NTND Ext:no edema NEURO:CNI Ox3 no asterixis GU  Foley in place   . amLODipine  10 mg Oral Daily  . cinacalcet  60 mg Oral BID WC  . clonazePAM  0.5 mg Oral QHS  . doxazosin  4 mg Oral QHS  . febuxostat  40 mg Oral Daily  . multivitamin  1 tablet Oral QHS  . sevelamer carbonate  800 mg Oral TID WC  . sodium bicarbonate  1,300 mg Oral BID   Dg Chest 2 View  10/08/2013   CLINICAL DATA:  Preoperative left thyroidectomy  EXAM: CHEST  2 VIEW  COMPARISON:  04/17/2013  FINDINGS: Cardiac shadow is within normal limits. The lungs are clear bilaterally. Postsurgical changes are noted in the cervical spine no acute bony abnormality is noted.  IMPRESSION: No active cardiopulmonary disease.   Electronically Signed   By: Alcide CleverMark  Lukens M.D.   On: 10/08/2013 09:25   BMET    Component Value Date/Time   NA 142 10/08/2013 0912   K 5.0 10/08/2013 0912   CL 108 10/08/2013 0912   CO2 17* 10/08/2013 0912   GLUCOSE 96 10/08/2013 0912   BUN 83* 10/08/2013 0912   CREATININE 8.22* 10/08/2013 0912   CALCIUM 10.5 10/08/2013 0912   GFRNONAA 7* 10/08/2013 0912   GFRAA 9* 10/08/2013 0912   CBC    Component Value Date/Time   WBC 6.8 10/08/2013 0912   RBC 3.29* 10/08/2013 0912   HGB 9.9* 10/08/2013 0912   HCT 29.0* 10/08/2013 0912   PLT 260 10/08/2013 0912   MCV 88.1 10/08/2013 0912   MCH 30.1 10/08/2013 0912   MCHC 34.1 10/08/2013 0912   RDW 13.1 10/08/2013 0912   LYMPHSABS 2.3 03/12/2013 1356   MONOABS 0.5 03/12/2013 1356   EOSABS 0.3 03/12/2013 1356   BASOSABS 0.1 03/12/2013 1356      Assessment: 1. SP Lt thyroidectomy 2. CKD 5 3. Urinary retention, prob sec to anesthesia and pain meds Plan: 1. DC IV 2. DC foley 3.  Get him up ambulating and see if he can urinate  Essance Gatti T

## 2013-10-09 NOTE — Progress Notes (Signed)
Subjective: Unable to void but catheterized and had a large amount of urine production. He has Foley catheter in place.  Objective: Vital signs in last 24 hours: Temp:  [97 F (36.1 C)-99.2 F (37.3 C)] 97.8 F (36.6 C) (08/04 0600) Pulse Rate:  [71-114] 83 (08/04 0600) Resp:  [14-22] 16 (08/04 0600) BP: (111-150)/(60-84) 111/60 mmHg (08/04 0600) SpO2:  [99 %-100 %] 99 % (08/04 0600) Weight change:  Last BM Date: 10/07/13  Intake/Output from previous day: 08/03 0701 - 08/04 0700 In: 2143.3 [P.O.:476; I.V.:1667.3] Out: 1936 [Urine:1900; Drains:36] Intake/Output this shift:    PHYSICAL EXAM: Next looks excellent, voice normal. JP removed.  Lab Results:  Recent Labs  10/08/13 0912  WBC 6.8  HGB 9.9*  HCT 29.0*  PLT 260   BMET  Recent Labs  10/08/13 0912  NA 142  K 5.0  CL 108  CO2 17*  GLUCOSE 96  BUN 83*  CREATININE 8.22*  CALCIUM 10.5    Studies/Results: Dg Chest 2 View  10/08/2013   CLINICAL DATA:  Preoperative left thyroidectomy  EXAM: CHEST  2 VIEW  COMPARISON:  04/17/2013  FINDINGS: Cardiac shadow is within normal limits. The lungs are clear bilaterally. Postsurgical changes are noted in the cervical spine no acute bony abnormality is noted.  IMPRESSION: No active cardiopulmonary disease.   Electronically Signed   By: Alcide CleverMark  Lukens M.D.   On: 10/08/2013 09:25    Medications: I have reviewed the patient's current medications.  Assessment/Plan: Postop day 1, doing well from surgical standpoint but inability to void. We'll keep the Foley catheter in place for several hours and then see how he does after is removed. He may go home once he is able to void.  LOS: 1 day   Cassady Stanczak 10/09/2013, 9:01 AM

## 2013-10-09 NOTE — Progress Notes (Signed)
UR completed 

## 2013-10-10 DIAGNOSIS — E041 Nontoxic single thyroid nodule: Secondary | ICD-10-CM | POA: Diagnosis not present

## 2013-10-10 LAB — RENAL FUNCTION PANEL
Albumin: 2.8 g/dL — ABNORMAL LOW (ref 3.5–5.2)
Anion gap: 18 — ABNORMAL HIGH (ref 5–15)
BUN: 77 mg/dL — ABNORMAL HIGH (ref 6–23)
CALCIUM: 6.7 mg/dL — AB (ref 8.4–10.5)
CO2: 15 mEq/L — ABNORMAL LOW (ref 19–32)
Chloride: 103 mEq/L (ref 96–112)
Creatinine, Ser: 8.28 mg/dL — ABNORMAL HIGH (ref 0.50–1.35)
GFR calc Af Amer: 8 mL/min — ABNORMAL LOW (ref >90)
GFR, EST NON AFRICAN AMERICAN: 7 mL/min — AB (ref >90)
Glucose, Bld: 99 mg/dL (ref 70–99)
Phosphorus: 5.4 mg/dL — ABNORMAL HIGH (ref 2.3–4.6)
Potassium: 4.4 mEq/L (ref 3.7–5.3)
SODIUM: 136 meq/L — AB (ref 137–147)

## 2013-10-10 LAB — CBC
HCT: 21.9 % — ABNORMAL LOW (ref 39.0–52.0)
Hemoglobin: 7.6 g/dL — ABNORMAL LOW (ref 13.0–17.0)
MCH: 30.9 pg (ref 26.0–34.0)
MCHC: 34.7 g/dL (ref 30.0–36.0)
MCV: 89 fL (ref 78.0–100.0)
PLATELETS: 205 10*3/uL (ref 150–400)
RBC: 2.46 MIL/uL — ABNORMAL LOW (ref 4.22–5.81)
RDW: 13.3 % (ref 11.5–15.5)
WBC: 9.3 10*3/uL (ref 4.0–10.5)

## 2013-10-10 NOTE — Discharge Summary (Signed)
Physician Discharge Summary  Patient ID: Mitchell Bryant MRN: [id number] DOB/AGE: 38/03/1975 38 y.o.  Admit date: 10/08/2013 Discharge date: 10/10/2013  Admission Diagnoses:Thyroid mass, renal failure  Discharge Diagnoses: Thyroid mass, ernal failure, urinary retention (post-op) Active Problems:   S/P thyroidectomy   Discharged Condition: good  Hospital Course: Urinary retention post op requiring foley catheter, voiding well by post op night 1.  Consults: Nephrology  Significant Diagnostic Studies: none  Treatments: surgery: thyroid lobectomy  Discharge Exam: Blood pressure 118/61, pulse 96, temperature 98.3 F (36.8 C), temperature source Oral, resp. rate 16, height 5\' 11"  (1.803 m), weight 183 lb (83.008 kg), SpO2 98.00%. PHYSICAL EXAM: Normal voice, incision excellent.  Disposition: 01-Home or Self Care  Discharge Instructions   Diet - low sodium heart healthy    Complete by:  As directed      Increase activity slowly    Complete by:  As directed             Medication List         amLODipine 10 MG tablet  Commonly known as:  NORVASC  Take 10 mg by mouth daily.     cinacalcet 60 MG tablet  Commonly known as:  SENSIPAR  Take 60 mg by mouth 2 (two) times daily.     clonazePAM 0.5 MG tablet  Commonly known as:  KLONOPIN  Take 0.5 mg by mouth at bedtime.     doxazosin 4 MG tablet  Commonly known as:  CARDURA  Take 4 mg by mouth at bedtime.     febuxostat 40 MG tablet  Commonly known as:  ULORIC  Take 40 mg by mouth daily.     HYDROcodone-acetaminophen 7.5-325 MG per tablet  Commonly known as:  NORCO  Take 1 tablet by mouth every 6 (six) hours as needed for moderate pain.     multivitamin Tabs tablet  Take 1 tablet by mouth daily.     promethazine 25 MG suppository  Commonly known as:  PHENERGAN  Place 1 suppository (25 mg total) rectally every 6 (six) hours as needed for nausea or vomiting.     sodium bicarbonate 650 MG tablet  Take 1,300 mg by  mouth 2 (two) times daily.           Follow-up Information   Follow up with Serena ColonelOSEN, Loletha Bertini, MD. Schedule an appointment as soon as possible for a visit in 1 week.   Specialty:  Otolaryngology   Contact information:   [address] [place] [address] [phone]       Signed: Serena ColonelROSEN, Sunita Demond 10/10/2013, 7:42 AM

## 2013-10-10 NOTE — Progress Notes (Signed)
Discussed discharge summary with patient. Reviewed all medications with patient. Patient received Rx. Patient did not have any other questions. Patient ready for discharge.

## 2013-10-11 ENCOUNTER — Encounter (HOSPITAL_COMMUNITY): Payer: Self-pay | Admitting: Otolaryngology

## 2013-10-23 ENCOUNTER — Encounter (HOSPITAL_COMMUNITY): Payer: Self-pay

## 2015-06-25 IMAGING — US US BIOPSY
1 series · 14 of 14 positions shown · non-contrast
Comparison: None.

CLINICAL DATA: Left thyroid nodule, request for fine needle
aspiration.

EXAM:
ULTRASOUND GUIDED NEEDLE ASPIRATE BIOPSY OF THE LEFT THYROID NODULE.

[Series 1: us biopsy · 0.06mm/px · 14 acquisitions, 14 frames shown]
[im 1/14]
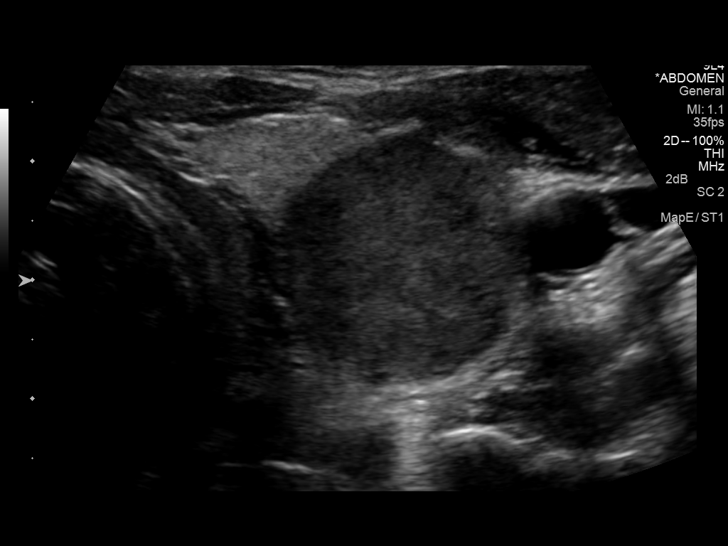
[im 2/14]
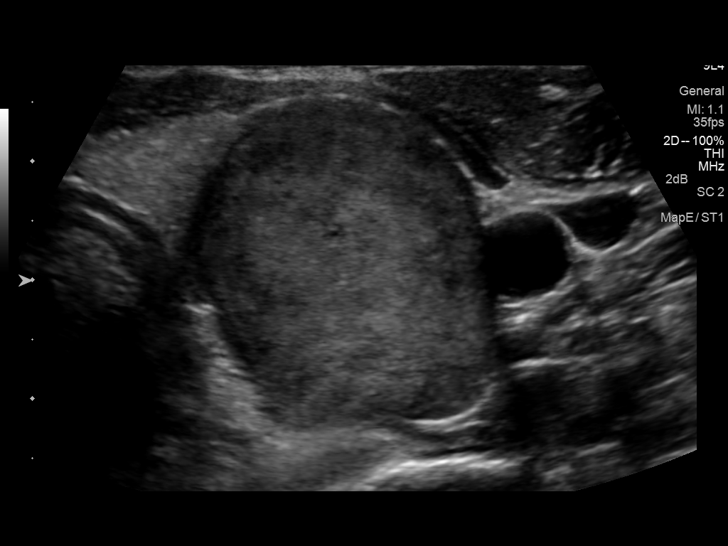
[im 3/14]
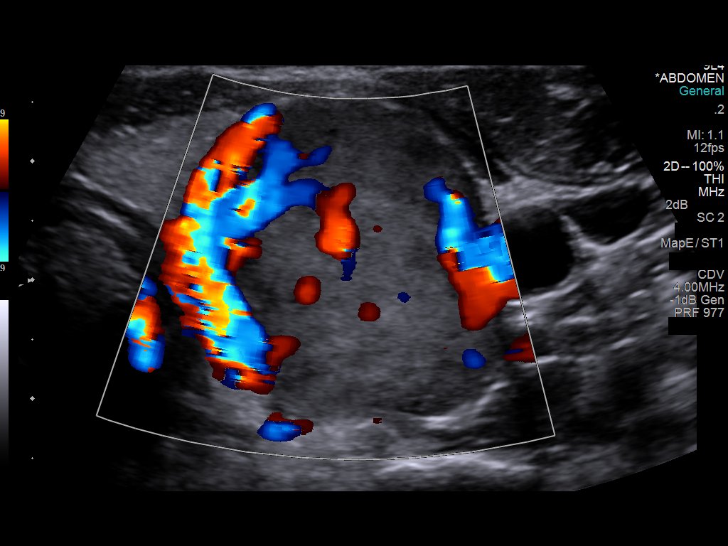
[im 4/14]
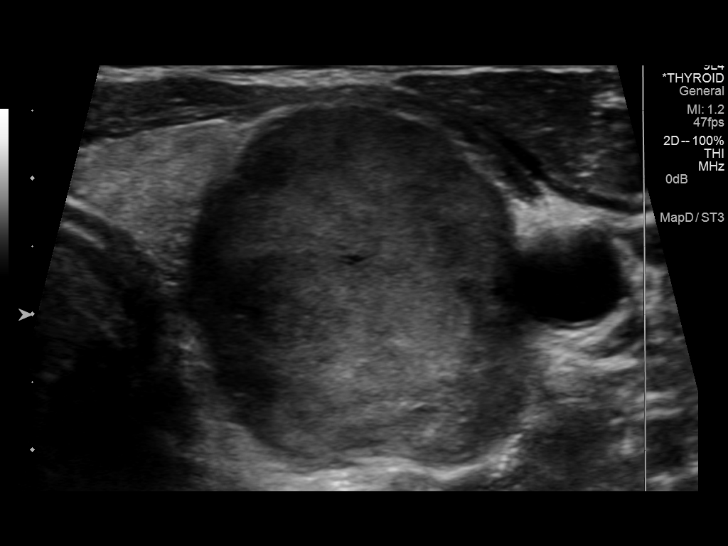
[im 5/14]
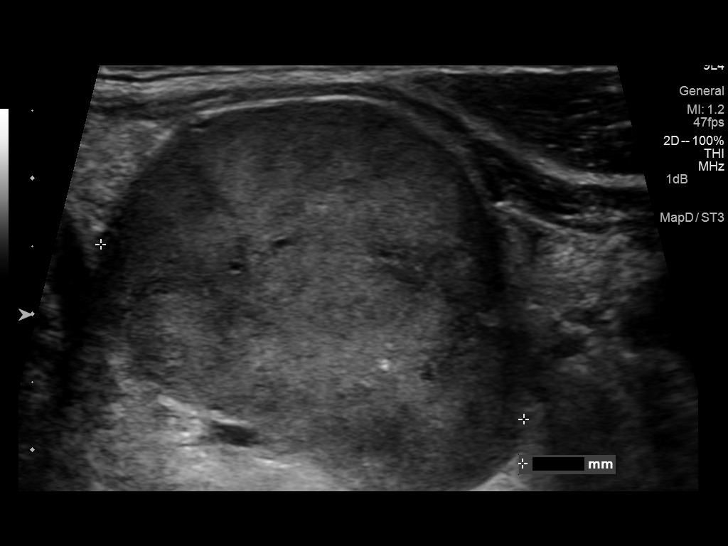
[im 6/14]
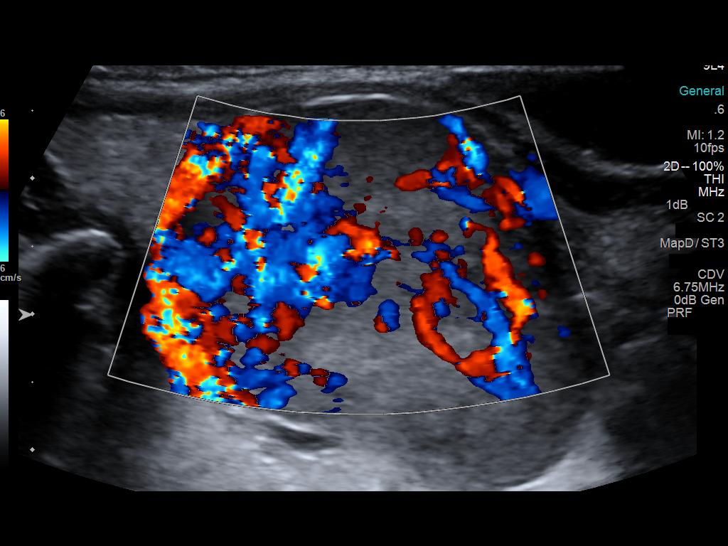
[im 7/14]
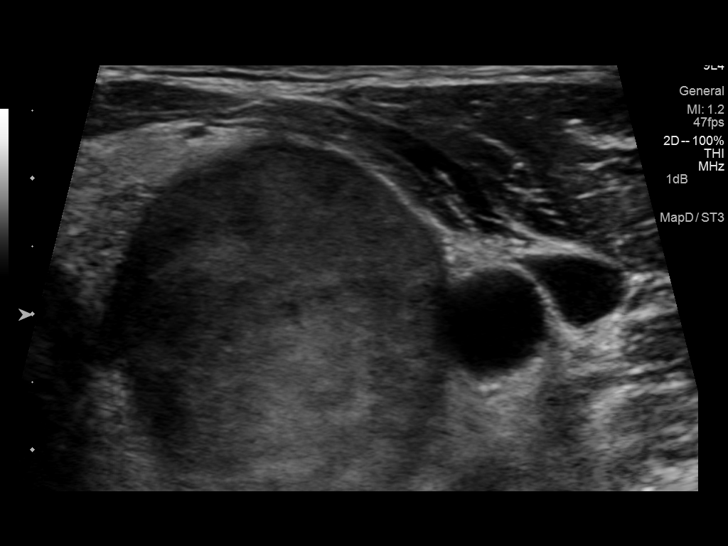
[im 8/14]
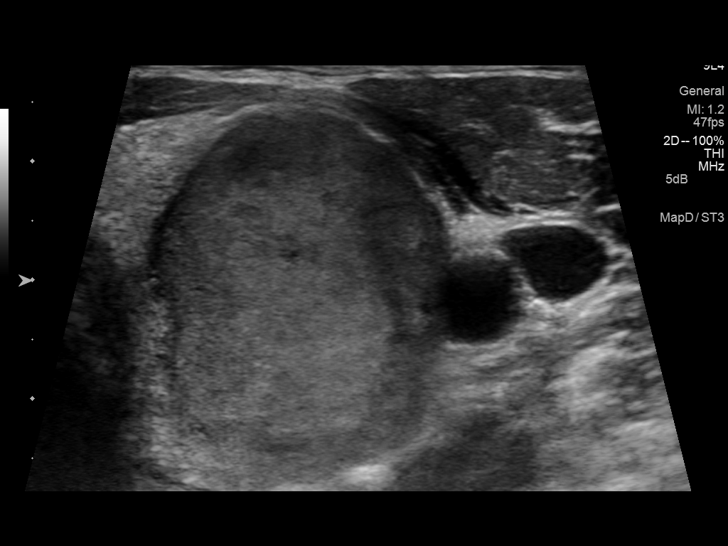
[im 9/14]
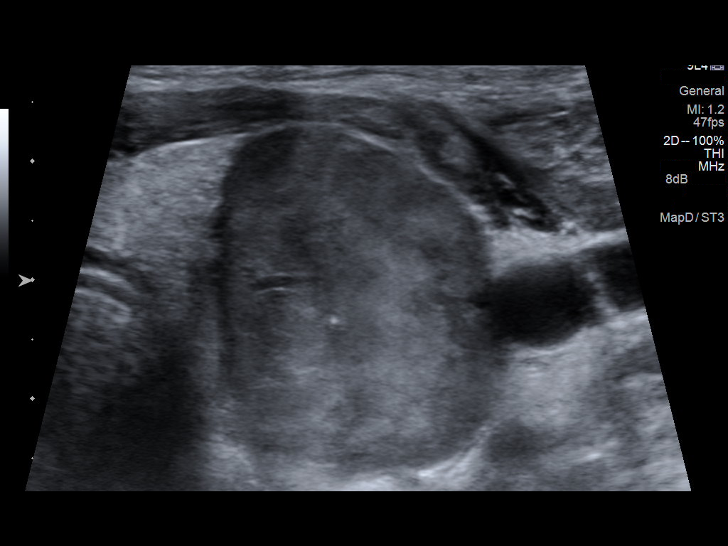
[im 10/14]
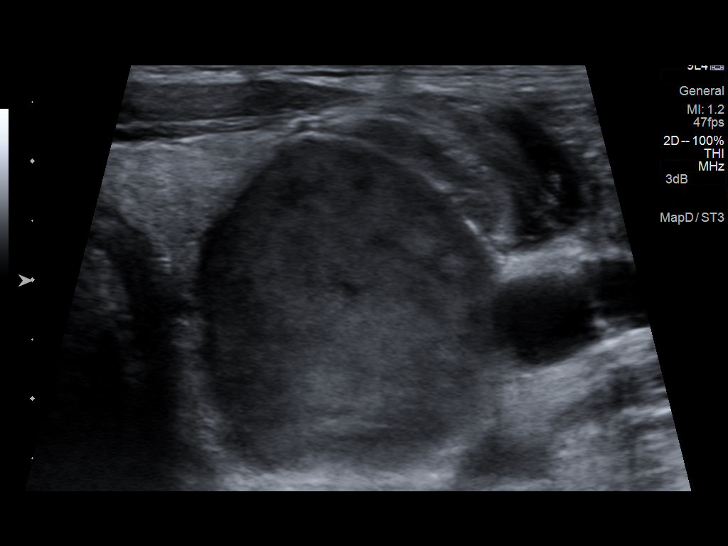
[im 11/14]
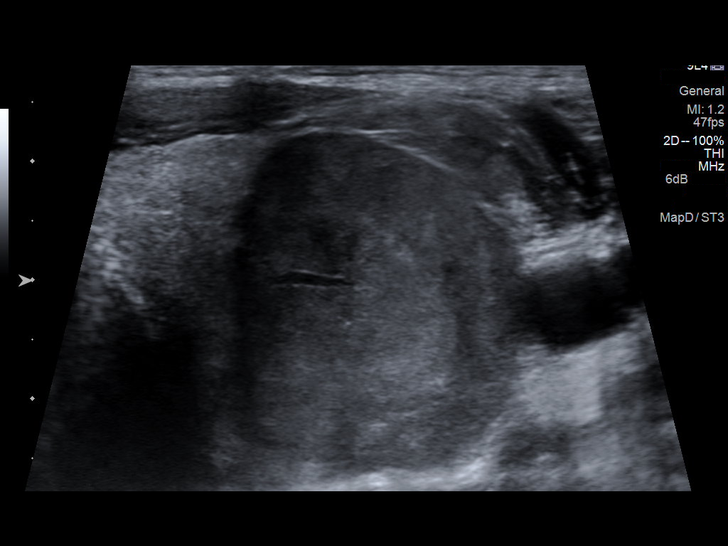
[im 12/14]
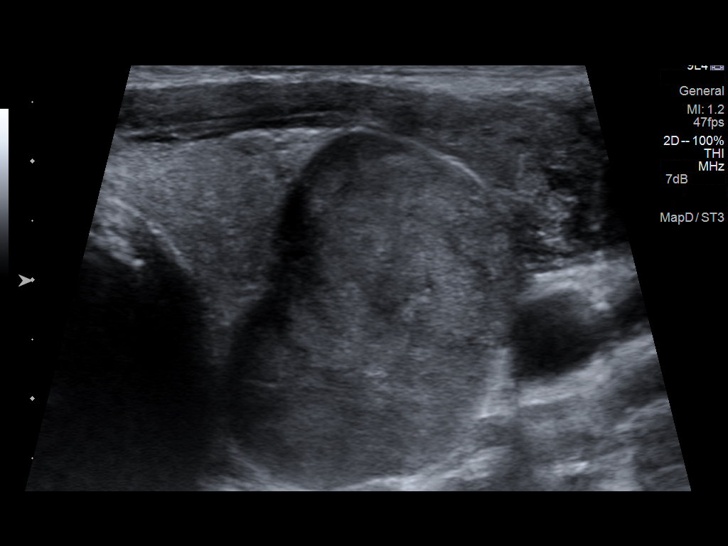
[im 13/14]
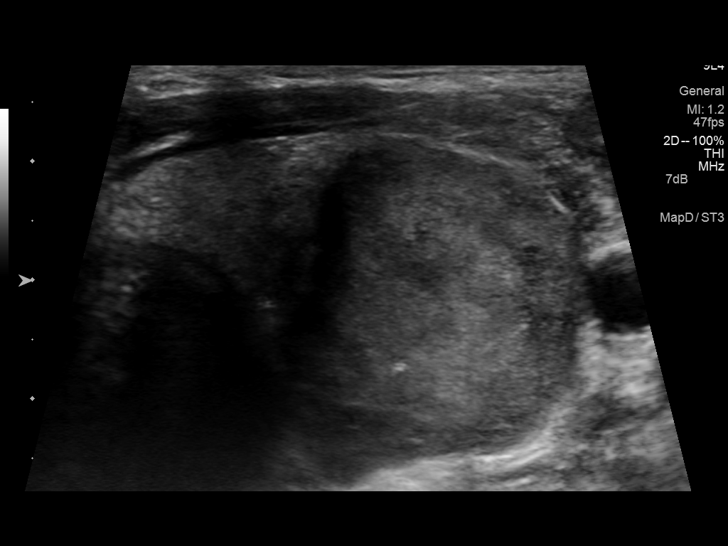
[im 14/14]
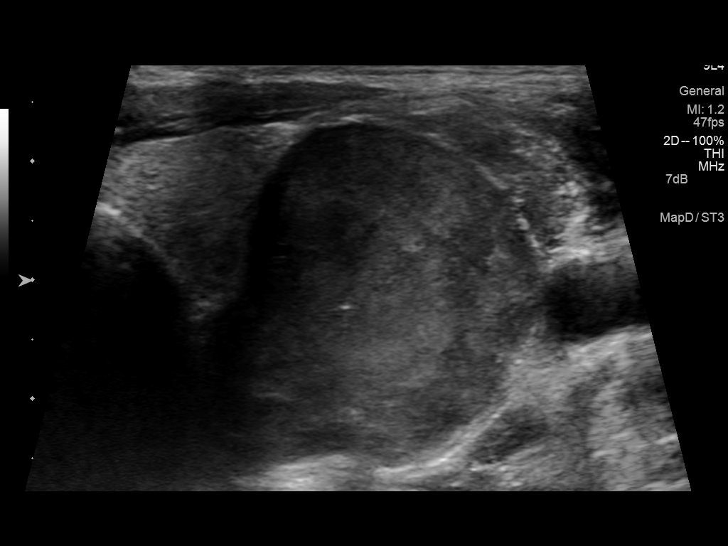

[14 of 14 positions shown; findings below may reference images not displayed]

PROCEDURE:
Thyroid biopsy was thoroughly discussed with the patient and
questions were answered. The benefits, risks, alternatives, and
complications were also discussed. The patient understands and
wishes to proceed with the procedure. Written consent was obtained.



Complications:  None.
FINDINGS: Successful large left thyroid nodule FNA.
IMPRESSION: Ultrasound guided needle aspirate biopsy performed of the left
thyroid nodule.

## 2015-07-05 IMAGING — CR DG CHEST 2V
2 series · 2 of 2 positions shown · non-contrast
Comparison: 04/17/2013

CLINICAL DATA: Preoperative left thyroidectomy

EXAM:
CHEST  2 VIEW

[w chest pa]
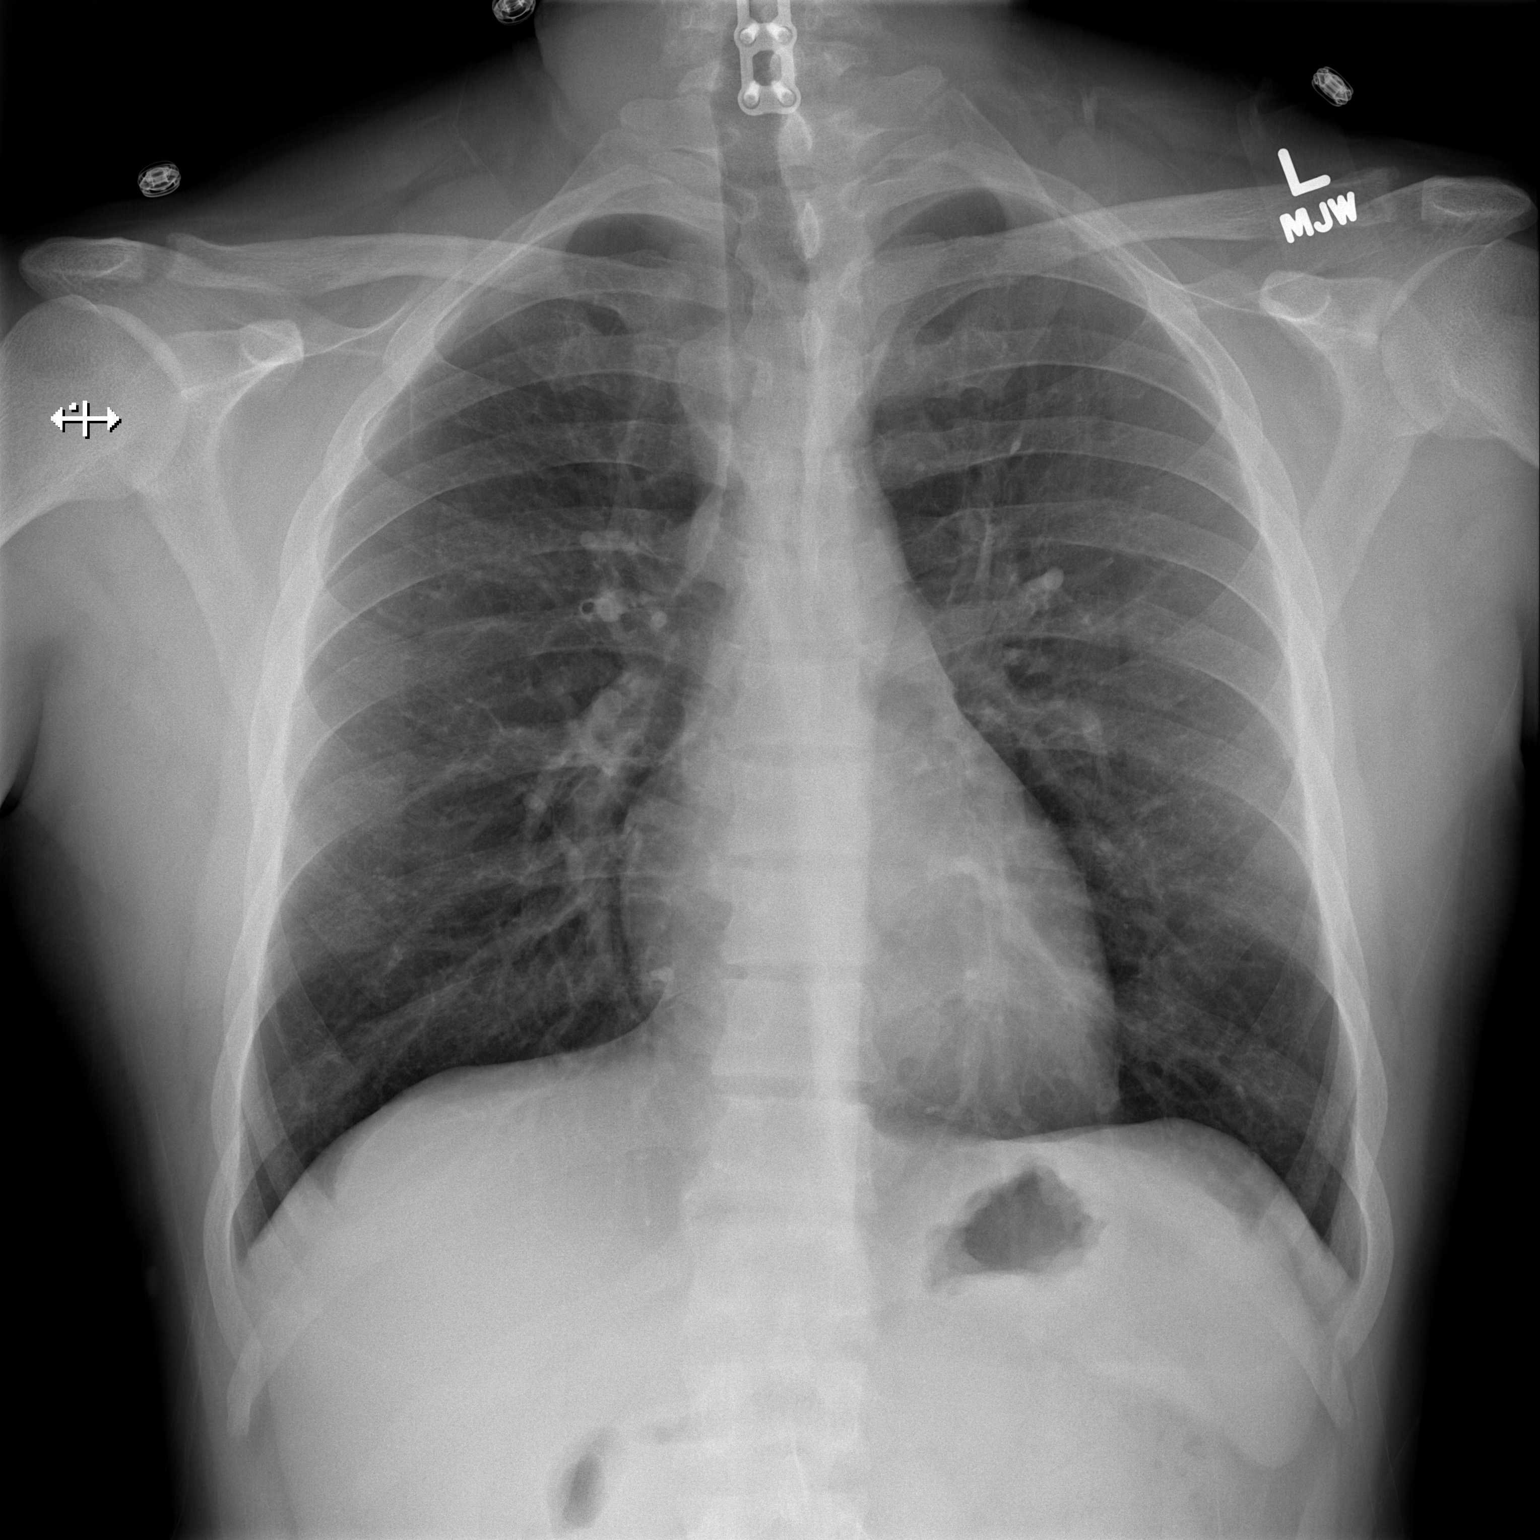

[w chest lat]
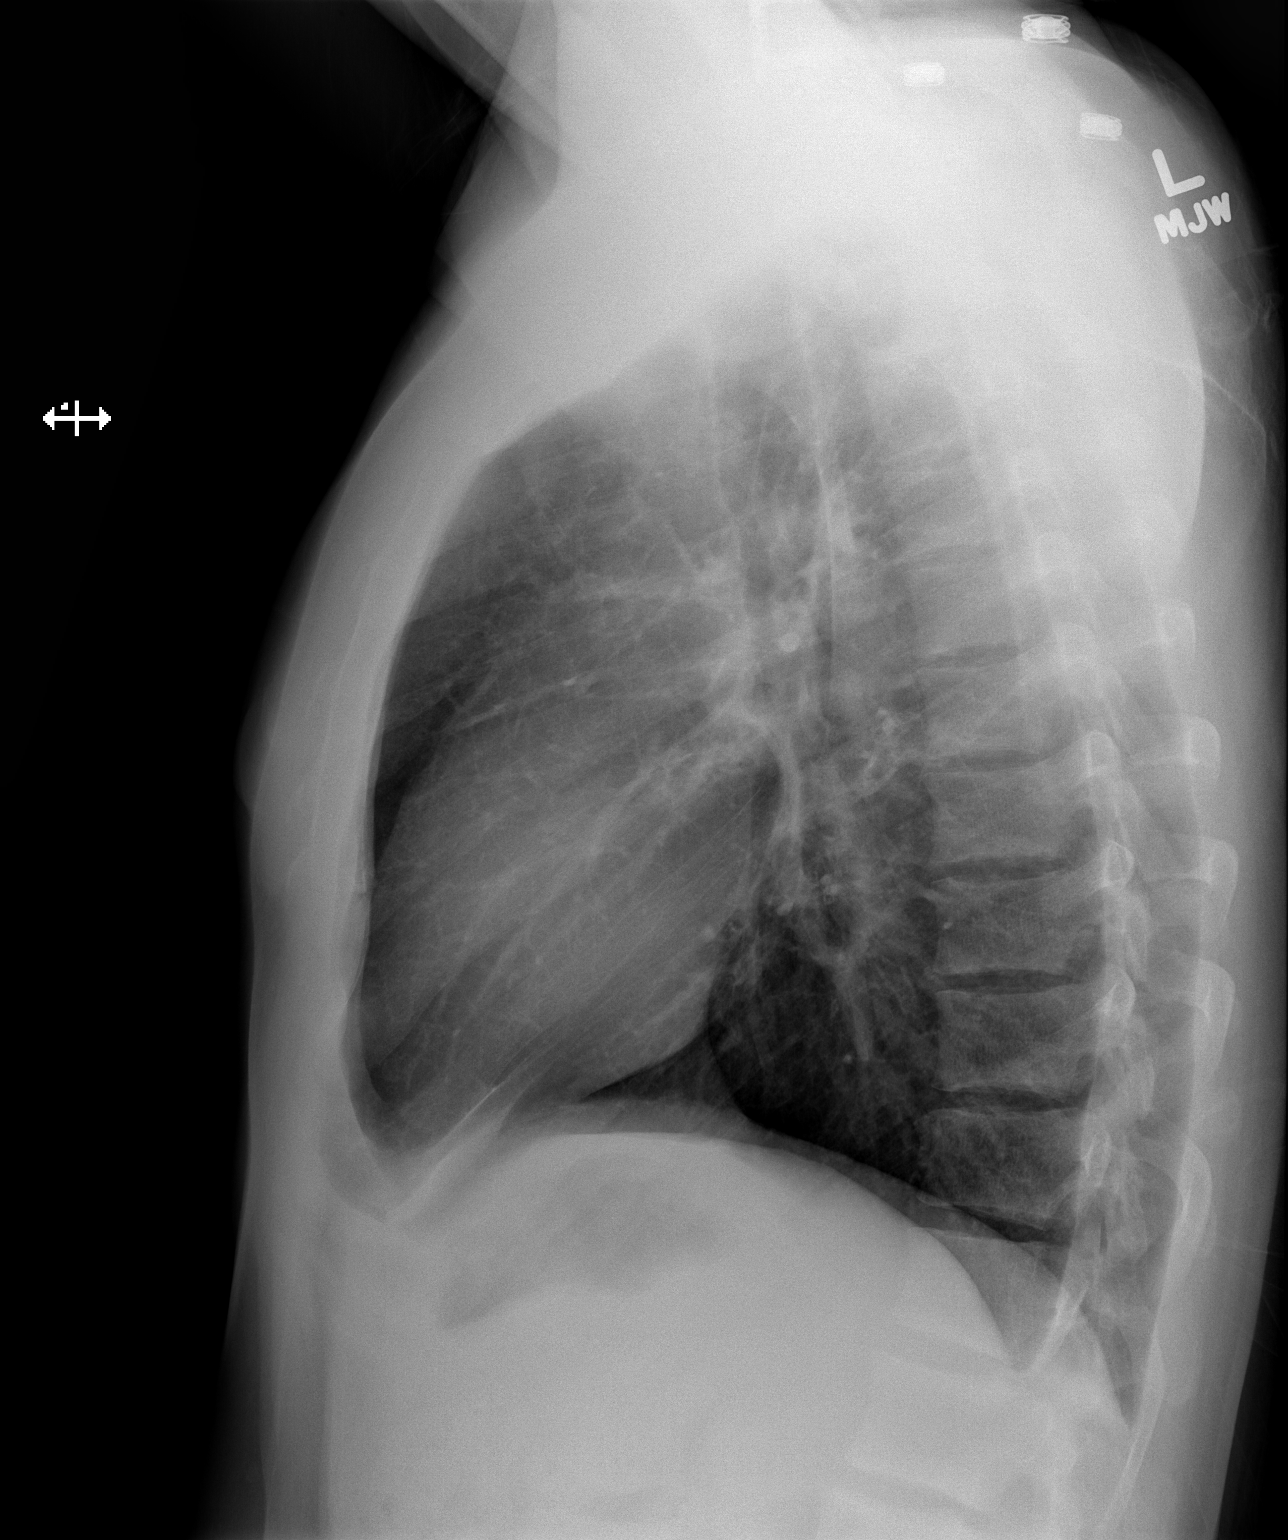

[2 of 2 positions shown; findings below may reference images not displayed]

FINDINGS: Cardiac shadow is within normal limits. The lungs are clear
bilaterally. Postsurgical changes are noted in the cervical spine no
acute bony abnormality is noted.
IMPRESSION: No active cardiopulmonary disease.

## 2019-06-09 ENCOUNTER — Encounter (HOSPITAL_BASED_OUTPATIENT_CLINIC_OR_DEPARTMENT_OTHER): Payer: Self-pay

## 2019-06-09 ENCOUNTER — Other Ambulatory Visit: Payer: Self-pay

## 2019-06-09 ENCOUNTER — Emergency Department (HOSPITAL_BASED_OUTPATIENT_CLINIC_OR_DEPARTMENT_OTHER)
Admission: EM | Admit: 2019-06-09 | Discharge: 2019-06-09 | Disposition: A | Discharge status: Home / Self Care | Payer: 59 | Attending: Emergency Medicine | Admitting: Emergency Medicine

## 2019-06-09 DIAGNOSIS — W11XXXA Fall on and from ladder, initial encounter: Secondary | ICD-10-CM | POA: Diagnosis not present

## 2019-06-09 DIAGNOSIS — Y9389 Activity, other specified: Secondary | ICD-10-CM | POA: Insufficient documentation

## 2019-06-09 DIAGNOSIS — S6992XA Unspecified injury of left wrist, hand and finger(s), initial encounter: Secondary | ICD-10-CM | POA: Diagnosis present

## 2019-06-09 DIAGNOSIS — Z79899 Other long term (current) drug therapy: Secondary | ICD-10-CM | POA: Insufficient documentation

## 2019-06-09 DIAGNOSIS — Y9289 Other specified places as the place of occurrence of the external cause: Secondary | ICD-10-CM | POA: Diagnosis not present

## 2019-06-09 DIAGNOSIS — I10 Essential (primary) hypertension: Secondary | ICD-10-CM | POA: Insufficient documentation

## 2019-06-09 DIAGNOSIS — S61412A Laceration without foreign body of left hand, initial encounter: Secondary | ICD-10-CM | POA: Insufficient documentation

## 2019-06-09 DIAGNOSIS — Y998 Other external cause status: Secondary | ICD-10-CM | POA: Insufficient documentation

## 2019-06-09 DIAGNOSIS — Z23 Encounter for immunization: Secondary | ICD-10-CM | POA: Diagnosis not present

## 2019-06-09 MED ORDER — LIDOCAINE HCL (PF) 1 % IJ SOLN
20.0000 mL | Freq: Once | INTRAMUSCULAR | Status: AC
  Administered 2019-06-09: 20 mL
  Filled 2019-06-09: qty 20

## 2019-06-09 MED ORDER — TETANUS-DIPHTH-ACELL PERTUSSIS 5-2.5-18.5 LF-MCG/0.5 IM SUSP
0.5000 mL | Freq: Once | INTRAMUSCULAR | Status: AC
  Administered 2019-06-09: 19:00:00 0.5 mL via INTRAMUSCULAR
  Filled 2019-06-09: qty 0.5

## 2019-06-09 NOTE — ED Notes (Signed)
Laceration tray, lidocaine, and suture cart at bedside.

## 2019-06-09 NOTE — ED Triage Notes (Signed)
Pt has laceration to left hand after falling with a ladder today.

## 2019-06-09 NOTE — Discharge Instructions (Addendum)
1. Medications: Tylenol or ibuprofen for pain, usual home medications  2. Treatment: ice for swelling, keep wound clean with warm soap and water and keep bandage dry, do not submerge in water for 24 hours  3. Follow Up: Please return in 7 days to have your stitches removed or sooner if you have concerns. Return to the emergency department for increased redness, drainage of pus from the wound   WOUND CARE  Keep area clean and dry for 24 hours. Do not remove bandage, if applied.  After 24 hours, remove bandage and wash wound gently with mild soap and warm water. Reapply a new bandage after cleaning wound, if directed.   Continue daily cleansing with soap and water until stitches are removed.  Do not apply any ointments or creams to the wound while stitches/staples are in place, as this may cause delayed healing. Return if you experience any of the following signs of infection: Swelling, redness, pus drainage, streaking, fever >101.0 F  Return if you experience excessive bleeding that does not stop after 15-20 minutes of constant, firm pressure.

## 2019-06-09 NOTE — ED Notes (Signed)
Dressing applied to left hand per order.  Reviewed discharge instructions.  Verbalized understanding.  Left at this time.

## 2019-06-09 NOTE — ED Provider Notes (Signed)
[facility] EMERGENCY DEPARTMENT Provider Note   CSN: [id number] Arrival date & time: 06/09/19  1706     History Chief Complaint  Patient presents with  . Laceration    Mitchell Bryant is a 44 y.o. male with past medical history as listed below presents to emergency department today with chief complaint of left hand laceration.  This happened approximately 1 hour prior to arrival.  Patient states he was on a ladder when it buckled underneath him and he fell landing on his forearms.  He thinks he hit his left hand on the metal rungs while falling.  He denies hitting his head or loss of consciousness.  He has a laceration to his left hand.  He has localized pain that he describes as aching.  He rates the pain 4 out of 10 in severity.  He did not take any medications for symptoms prior to arrival.  He thinks he is due for tetanus booster.  Denies any headache, neck pain, visual changes, chest pain or shortness of breath, back pain, abdominal pain, numbness, tingling, weakness.  Patient is not anticoagulated.  History provided by patient with additional history obtained from chart review.     Past Medical History:  Diagnosis Date  . Constipation   . Gastric ulcer 2004   hx of - bleed  . GERD (gastroesophageal reflux disease)   . History of blood transfusion    due to GI Bleed  . Hyperparathyroidism ([place])   . Hypertension   . Hyperuricemia   . Insomnia    due to pain  . Kidney failure    Pt has one kidney functioning at 12%.  Is on Transplant list.  . Thyroid condition     Patient Active Problem List   Diagnosis Date Noted  . 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 HNP (herniated nucleus pulposus), cervical 03/15/2013  . Right foot pain 06/19/2012    Past Surgical History:  Procedure Laterality Date  . ANTERIOR CERVICAL DECOMP/DISCECTOMY FUSION N/A 03/15/2013   Procedure: ANTERIOR CERVICAL DECOMPRESSION/DISCECTOMY FUSION 2 LEVELS;  Surgeon: Otilio Connors, MD;  Location: [facility];  Service: Neurosurgery;  Laterality: N/A;  C5-6 C6-7 Anterior cervical decompression/diskectomy/fusion  . APPENDECTOMY    . COLONOSCOPY    . KIDNEY SURGERY     biospy  . THYROIDECTOMY Left 10/08/2013   Procedure: LEFT THYROIDECTOMY WITH FROZEN SECTIOn;  Surgeon: Izora Gala, MD;  Location: [facility];  Service: ENT;  Laterality: Left;       No family history on file.  Social History   Tobacco Use  . Smoking status: Never Smoker  . Smokeless tobacco: Never Used  Substance Use Topics  . Alcohol use: No  . Drug use: No    Home Medications Prior to Admission medications   Medication Sig Start Date End Date Taking? Authorizing Provider  amLODipine (NORVASC) 10 MG tablet Take 10 mg by mouth daily.    [provider]  cinacalcet (SENSIPAR) 60 MG tablet Take 60 mg by mouth 2 (two) times daily.     [provider]  clonazePAM (KLONOPIN) 0.5 MG tablet Take 0.5 mg by mouth at bedtime.     [provider]  doxazosin (CARDURA) 4 MG tablet Take 4 mg by mouth at bedtime.    [provider]  febuxostat (ULORIC) 40 MG tablet Take 40 mg by mouth daily.    [provider]  HYDROcodone-acetaminophen (NORCO) 7.5-325 MG per tablet Take 1 tablet by mouth every 6 (six) hours  as needed for moderate pain. 10/08/13   Serena Colonel, MD  multivitamin (RENA-VIT) TABS tablet Take 1 tablet by mouth daily.    [provider]  promethazine (PHENERGAN) 25 MG suppository Place 1 suppository (25 mg total) rectally every 6 (six) hours as needed for nausea or vomiting. 10/08/13   Serena Colonel, MD  sodium bicarbonate 650 MG tablet Take 1,300 mg by mouth 2 (two) times daily.     [provider]    Allergies    Nsaids  Review of Systems   Review of Systems  All other systems are reviewed and are negative for acute change except as noted in the HPI.   Physical Exam Updated Vital Signs BP 134/88 (BP Location: Right Arm)   Pulse 84   Temp 98.1 F  (36.7 C) (Oral)   Resp 18   Ht 5\' 11"  (1.803 m)   Wt 88.5 kg   SpO2 98%   BMI 27.20 kg/m   Physical Exam Vitals and nursing note reviewed.  Constitutional:      General: He is not in acute distress.    Appearance: He is not ill-appearing.  HENT:     Head: Normocephalic and atraumatic. No Battle's sign.     Jaw: There is normal jaw occlusion.     Right Ear: Tympanic membrane and external ear normal. No hemotympanum.     Left Ear: Tympanic membrane and external ear normal. No hemotympanum.     Nose: Nose normal. No nasal tenderness.     Mouth/Throat:     Mouth: Mucous membranes are moist.     Pharynx: Oropharynx is clear.  Eyes:     General: No scleral icterus.       Right eye: No discharge.        Left eye: No discharge.     Extraocular Movements: Extraocular movements intact.     Conjunctiva/sclera: Conjunctivae normal.     Pupils: Pupils are equal, round, and reactive to light.  Neck:     Vascular: No JVD.     Comments: Full ROM intact without spinous process TTP. No bony stepoffs or deformities, no paraspinous muscle TTP or muscle spasms. No rigidity or meningeal signs. No bruising, erythema, or swelling.  Cardiovascular:     Rate and Rhythm: Normal rate and regular rhythm.     Pulses: Normal pulses.          Radial pulses are 2+ on the right side and 2+ on the left side.     Heart sounds: Normal heart sounds.  Pulmonary:     Comments: Lungs clear to auscultation in all fields. Symmetric chest rise. No wheezing, rales, or rhonchi. Chest:     Comments: No anterior chest wall tenderness.  No deformity or crepitus noted.  No evidence of flail chest.  Abdominal:     Comments: Abdomen is soft, non-distended, and non-tender in all quadrants. No rigidity, no guarding. No peritoneal sign.  No ecchymosis  Musculoskeletal:        General: Normal range of motion.     Cervical back: Normal range of motion.     Comments: Moving all extremities without signs of injury. No  thoracic, or lumbar spinal tenderness to palpation. No paraspinal tenderness. No step offs, crepitus or deformity palpated.  Ambulates with steady gait, no deficit   Skin:    General: Skin is warm and dry.     Capillary Refill: Capillary refill takes less than 2 seconds.     Comments: 2 cm  laceration to dorsal aspect of left hand. No active bleeding  Neurological:     Mental Status: He is oriented to person, place, and time.     GCS: GCS eye subscore is 4. GCS verbal subscore is 5. GCS motor subscore is 6.     Comments: Fluent speech, no facial droop.  Psychiatric:        Behavior: Behavior normal.       ED Results / Procedures / Treatments   Labs (all labs ordered are listed, but only abnormal results are displayed) Labs Reviewed - No data to display  EKG None  Radiology No results found.  Procedures .Marland KitchenLaceration Repair  Date/Time: 06/09/2019 7:14 PM Performed by: Sherene Sires, PA-C Authorized by: Sherene Sires, PA-C   Consent:    Consent obtained:  Verbal   Consent given by:  Patient   Risks discussed:  Infection   Alternatives discussed:  No treatment Anesthesia (see MAR for exact dosages):    Anesthesia method:  Local infiltration   Local anesthetic:  Lidocaine 1% w/o epi Laceration details:    Location:  Hand   Hand location:  L hand, dorsum   Length (cm):  2   Depth (mm):  1 Repair type:    Repair type:  Simple Pre-procedure details:    Preparation:  Patient was prepped and draped in usual sterile fashion Exploration:    Hemostasis achieved with:  Direct pressure   Wound exploration: wound explored through full range of motion and entire depth of wound probed and visualized   Treatment:    Area cleansed with:  Saline   Amount of cleaning:  Standard   Irrigation solution:  Sterile water   Irrigation volume:  500 ml   Irrigation method:  Syringe   Visualized foreign bodies/material removed: no   Skin repair:    Repair method:  Sutures    Suture size:  5-0   Suture technique:  Simple interrupted   Number of sutures:  2 Approximation:    Approximation:  Close Post-procedure details:    Dressing:  Bulky dressing   Patient tolerance of procedure:  Tolerated well, no immediate complications   (including critical care time)  Medications Ordered in ED Medications  lidocaine (PF) (XYLOCAINE) 1 % injection 20 mL (20 mLs Infiltration Given by Other 06/09/19 1834)  Tdap (BOOSTRIX) injection 0.5 mL (0.5 mLs Intramuscular Given 06/09/19 1835)    ED Course  I have reviewed the triage vital signs and the nursing notes.  Pertinent labs & imaging results that were available during my care of the patient were reviewed by me and considered in my medical decision making (see chart for details).    MDM Rules/Calculators/A&P                      Patient presents to the emergency department with laceration to left hand dorsum which occurred within 1 hours PTA . Patient nontoxic appearing, resting comfortably.  Engaged in shared decision making regarding imaging of left hand.  He has no pain in his wrist and full range of motion.  Sensation is intact.  He does not wish to proceed with any x-rays, I feel this is reasonable.  His exam is otherwise reassuring, there are no signs of serious head or neck or back injury.  Pressure irrigation performed. Wound explored and base of wound visualized in a bloodless field without evidence of foreign body. Laceration repair per procedure note above, tolerated well. Tetanus updated at  today's visit. Do not feel that abx are indicated at this time based on wound appearance and lack of significant comorbidities. Discussed suture home care as well as need for wound recheck and suture removal in 7 days.  I discussed results, treatment plan, need for follow-up, and return precautions with the patient including signs of infection. Provided opportunity for questions, patient confirmed understanding and is in agreement  with plan.   Portions of this note were generated with Scientist, clinical (histocompatibility and immunogenetics). Dictation errors may occur despite best attempts at proofreading.     Final Clinical Impression(s) / ED Diagnoses Final diagnoses:  Laceration of left hand without foreign body, initial encounter    Rx / DC Orders ED Discharge Orders    None       Kathyrn Lass 06/09/19 1917    Terrilee Files, MD 06/10/19 [phone]
